# Patient Record
Sex: Male | Born: 1990
Health system: Southern US, Community
[De-identification: ages and names within clinical notes are randomized; demographics above are authoritative.]

## PROBLEM LIST (undated history)

## (undated) DIAGNOSIS — F1910 Other psychoactive substance abuse, uncomplicated: Secondary | ICD-10-CM

## (undated) DIAGNOSIS — F32A Depression, unspecified: Secondary | ICD-10-CM

## (undated) DIAGNOSIS — R1115 Cyclical vomiting syndrome unrelated to migraine: Secondary | ICD-10-CM

## (undated) DIAGNOSIS — I10 Essential (primary) hypertension: Secondary | ICD-10-CM

## (undated) DIAGNOSIS — F329 Major depressive disorder, single episode, unspecified: Secondary | ICD-10-CM

## (undated) HISTORY — DX: Major depressive disorder, single episode, unspecified: F32.9

## (undated) HISTORY — PX: EYE SURGERY: SHX253

## (undated) HISTORY — DX: Cyclical vomiting syndrome unrelated to migraine: R11.15

## (undated) HISTORY — DX: Depression, unspecified: F32.A

## (undated) HISTORY — DX: Other psychoactive substance abuse, uncomplicated: F19.10

## (undated) HISTORY — DX: Essential (primary) hypertension: I10

---
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
## 2009-03-06 ENCOUNTER — Encounter: Payer: Self-pay | Admitting: Family Medicine

## 2009-03-06 ENCOUNTER — Ambulatory Visit: Payer: Self-pay | Admitting: Family Medicine

## 2009-03-06 DIAGNOSIS — F329 Major depressive disorder, single episode, unspecified: Secondary | ICD-10-CM | POA: Insufficient documentation

## 2009-03-06 DIAGNOSIS — R112 Nausea with vomiting, unspecified: Secondary | ICD-10-CM | POA: Insufficient documentation

## 2009-03-06 LAB — CONVERTED CEMR LAB
AST: 15 units/L (ref 0–37)
Albumin: 4.8 g/dL (ref 3.5–5.2)
Alkaline Phosphatase: 65 units/L (ref 39–117)
Bilirubin Urine: NEGATIVE
Calcium: 9.8 mg/dL (ref 8.4–10.5)
Potassium: 2.9 meq/L — ABNORMAL LOW (ref 3.5–5.3)
Protein, U semiquant: 100
Sodium: 134 meq/L — ABNORMAL LOW (ref 135–145)
Specific Gravity, Urine: 1.02
Urobilinogen, UA: >=8
pH: 7

## 2009-03-07 ENCOUNTER — Ambulatory Visit: Payer: Self-pay | Admitting: Family Medicine

## 2009-03-07 ENCOUNTER — Encounter: Payer: Self-pay | Admitting: Family Medicine

## 2009-03-07 LAB — CONVERTED CEMR LAB
BUN: 13 mg/dL (ref 6–23)
CO2: 28 meq/L (ref 19–32)
Calcium: 10 mg/dL (ref 8.4–10.5)
Chloride: 98 meq/L (ref 96–112)
Creatinine, Ser: 0.88 mg/dL (ref 0.40–1.50)
Glucose, Bld: 125 mg/dL — ABNORMAL HIGH (ref 70–99)
Potassium: 3.4 meq/L — ABNORMAL LOW (ref 3.5–5.3)
Sodium: 135 meq/L (ref 135–145)

## 2009-03-09 ENCOUNTER — Ambulatory Visit: Payer: Self-pay | Admitting: Family Medicine

## 2009-04-18 ENCOUNTER — Telehealth: Payer: Self-pay | Admitting: Family Medicine

## 2009-04-19 ENCOUNTER — Ambulatory Visit: Payer: Self-pay | Admitting: Family Medicine

## 2009-06-03 ENCOUNTER — Emergency Department (HOSPITAL_COMMUNITY): Admission: EM | Admit: 2009-06-03 | Discharge: 2009-06-03 | Payer: Self-pay | Admitting: Emergency Medicine

## 2009-06-05 ENCOUNTER — Encounter: Payer: Self-pay | Admitting: Family Medicine

## 2009-06-08 ENCOUNTER — Encounter: Payer: Self-pay | Admitting: Family Medicine

## 2009-06-08 HISTORY — PX: ESOPHAGOGASTRODUODENOSCOPY: SHX1529

## 2009-11-01 ENCOUNTER — Inpatient Hospital Stay (HOSPITAL_COMMUNITY)
Admission: EM | Admit: 2009-11-01 | Discharge: 2009-11-02 | Payer: Self-pay | Source: Home / Self Care | Admitting: Family Medicine

## 2009-11-01 ENCOUNTER — Encounter: Payer: Self-pay | Admitting: Family Medicine

## 2009-11-01 ENCOUNTER — Ambulatory Visit: Payer: Self-pay | Admitting: Family Medicine

## 2009-11-01 LAB — CONVERTED CEMR LAB
ALT: 25 units/L (ref 0–53)
AST: 24 units/L (ref 0–37)
BUN: 6 mg/dL (ref 6–23)
CO2: 24 meq/L (ref 19–32)
Creatinine, Ser: 0.83 mg/dL (ref 0.40–1.50)
Glucose, Bld: 119 mg/dL — ABNORMAL HIGH (ref 70–99)
HCT: 44.5 % (ref 39.0–52.0)
Hemoglobin: 15.8 g/dL (ref 13.0–17.0)
MCHC: 35.5 g/dL (ref 30.0–36.0)
MCV: 83.6 fL (ref 78.0–100.0)
RBC: 5.32 M/uL (ref 4.22–5.81)
Sodium: 141 meq/L (ref 135–145)
Total Protein: 8.2 g/dL (ref 6.0–8.3)

## 2009-11-06 ENCOUNTER — Encounter: Payer: Self-pay | Admitting: Family Medicine

## 2010-02-07 NOTE — Letter (Signed)
Summary: Out of School  [facility]  [address]   [place], [place] [address]   Phone: [phone]  Fax: [phone]    October 24, 2008   Student:  Samuel Robertson    To Whom It May Concern:   For Medical reasons, please excuse the above named student from school for the following dates:  Start:   10/05/08  End:    10/20/08  If you need additional information, please feel free to contact our office.   Sincerely,    Helane Rima DO    ****This is a legal document and cannot be tampered with.  Schools are authorized to verify all information and to do so accordingly.

## 2010-02-07 NOTE — Progress Notes (Signed)
Summary: please read message/ts  Phone Note Call from Patient Call back at [phone]   Caller: mom-Martina Summary of Call: he is in [facility] for stomach pains and mom would like to talk to Dr. Earlene Plater about this. Initial call taken by: Clydell Hakim,  October 16, 2008 8:51 AM  Follow-up for Phone Call        pt has upcoming appt with dr.Novice Vrba. 10-18-08. if pt is in hospital, he will be taken care of, needs to keep appt with dr.Caira Poche. she can not call the pt for this. Follow-up by: Arlyss Repress CMA,,  October 16, 2008 12:38 PM  Additional Follow-up for Phone Call Additional follow up Details #1::        i will be going to see the patient in the hospital in the am and will speak with her if she is there. Additional Follow-up by: Helane Rima MD,  October 16, 2008 6:21 PM     Appended Document: please read message/ts Saw patient in hospital and discussed with patient's mother.

## 2010-02-07 NOTE — Assessment & Plan Note (Signed)
Summary: f/u weds visit/per wallace/eo   Vital Signs:  Patient profile:   20 year old male Weight:      145 pounds Pulse rate:   77 / minute BP sitting:   119 / 80  Vitals Entered By: Arlyss Repress CMA, (March 09, 2009 11:07 AM) CC: f/up labs and last ov. pt states 'i feel much better' Is Patient Diabetic? No Pain Assessment Patient in pain? no        Primary Care Provider:  Helane Rima DO  CC:  f/up labs and last ov. pt states 'i feel much better'.  History of Present Illness: CC: f/u abd pain/n/v  f/u today of nausea/vomiting and abd pain thought to be CVS exacerbated by EtOH and MJ.  Sxs started 1 wk ago, states improved with zofran 8mg  and controlled with ativan.  Now good appetite, keeping food down.  Also found to be hypokalemic to 2.9, repleted with bananas and KCl supplementation.  Quit smoking years ago.  Recently stopped MJ and EtOH because of exacerbation of n/v.  Never diarrhea or fever.  Habits & Providers  Alcohol-Tobacco-Diet     Tobacco Status: quit  Current Medications (verified): 1)  Protonix 40 Mg  Tbec (Pantoprazole Sodium) .... Once Daily 2)  Propranolol Hcl 40 Mg Tabs (Propranolol Hcl) .... One By Mouth Two Times A Day 3)  Promethazine Hcl 25 Mg Tabs (Promethazine Hcl) .... One By Mouth Q 6 Hours As Needed Vomiting 4)  Ondansetron Hcl 8 Mg Tabs (Ondansetron Hcl) .... One Tab Three Times A Day As Needed Nausea 5)  Potassium Chloride Cr 10 Meq  Tbcr (Potassium Chloride) .... Take 2 By Mouth Daily 6)  Remeron 15 Mg  Tabs (Mirtazapine) .Marland Kitchen.. 1 Qhs 7)  Ativan 0.5 Mg  Tabs (Lorazepam) .... 1/2 To 1 By Mouth Up To Twice Daily  Allergies (verified): No Known Drug Allergies  Past History:  Past medical, surgical, family and social histories (including risk factors) reviewed for relevance to current acute and chronic problems.  Past Medical History: Reviewed history from 12/20/2008 and no changes required. Pes planus w/ occasional achilles  tendonitis Hospitalization for intractable nausea/vomiting/abd pain secondary to GE, gastritis, + candida    -- 10/13/08 - 10/19/08, 11/14/08 - 11/20/08  Past Surgical History: Reviewed history from 12/20/2008 and no changes required. R eye surgery  Family History: Reviewed history from 12/20/2008 and no changes required. HTN - mother's side of family  Mother with Cutaneous Sarcoidosis Mother and Father have GERD   Social History: Reviewed history from 03/06/2009 and no changes required. 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  Lives with mother and partially with girlfriend. Nonsmoker, reports marijuana use. Denies ETOH and other drugs.Smoking Status:  quit  Physical Exam  General:  Well-developed,well-nourished,in no acute distress; alert,appropriate and cooperative throughout examination Abdomen:  Bowel sounds positive,abdomen soft and non-tender without masses, organomegaly or hernias noted. Extremities:  no bilateral lower extremity edema   Impression & Recommendations:  Problem # 1:  NAUSEA WITH VOMITING (ICD-787.01) feeling better.  advised to use meds only as PRNs.  encouragd abstinence from MJ, EtOH.  ? CVS that responded well to Zofran 8mg  and ativan.  RTC as needed. Orders: [facility]- Est Level  3 ([address])  Problem # 2:  HYPOKALEMIA (ICD-276.8) K up to 3.4 2 days ago.  no further need to check as pt feeling better.  encourage fruits/vegetables.  Complete Medication List: 1)  Protonix 40 Mg Tbec (Pantoprazole sodium) .... Once daily 2)  Propranolol Hcl  40 Mg Tabs (Propranolol hcl) .... One by mouth two times a day 3)  Promethazine Hcl 25 Mg Tabs (Promethazine hcl) .... One by mouth q 6 hours as needed vomiting 4)  Ondansetron Hcl 8 Mg Tabs (Ondansetron hcl) .... One tab three times a day as needed nausea 5)  Potassium Chloride Cr 10 Meq Tbcr (Potassium chloride) .... Take 2 by mouth daily 6)  Remeron 15 Mg Tabs (Mirtazapine) .Marland Kitchen.. 1 qhs 7)  Ativan 0.5 Mg Tabs  (Lorazepam) .... 1/2 to 1 by mouth up to twice daily  Patient Instructions: 1)  I'm glad you are feeling better. 2)  Restart normal diet slowly, trying to stay away from fried greasy fatty spicy foods at least initially. 3)  I'm glad you've stopped the MJ and alcohol - this will hopefully help prevent further nausea attacks. 4)  If you make sure to eat plenty of fruits and vegetables, I think you can slowly back off on the potassium supplementation over the next several days. 5)  Call clinic with questions.

## 2010-02-07 NOTE — Miscellaneous (Signed)
Summary: Orders Update   Clinical Lists Changes  Orders: Added new Referral order of Gastroenterology Referral (GI) - Signed 
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
## 2010-02-07 NOTE — Assessment & Plan Note (Signed)
Summary: persistant nausea/Atkinson/wallace   Vital Signs:  Patient profile:   20 year old male Weight:      146 pounds Temp:     98.4 degrees F Pulse rate:   69 / minute BP sitting:   151 / 110  Vitals Entered By: Jone Baseman CMA (April 19, 2009 3:42 PM) CC: N&V x 4 days Pain Assessment Patient in pain? yes     Location: stomach Intensity: 5   Primary Care Provider:  Helane Rima DO  CC:  N&V x 4 days.  History of Present Illness: 1) Nausea / emesis: x 4 days. Chronic problem thought to be cyclic vomiting syndrome. Started 4 dayds ago; no specific trigger. Non-bloody, non-bilious. Threw up 5-10 times on first day, then 1 x per day for the next few days. Has been taking Zofran once a day. Smokes marijuana - the smell makes him nauseated, but the overall effect is to call him down and help with nausea.  Reports mild presyncope. Reports constipation (no BM in past 4 days); this has also been a chronic issue for him. Reports some mild diffuse abdominal pain. Denies fever, sick contacts, diarrhea, melena, hematochezia, dyspnea.  Drinking Gatorade to help stay hydrated and for electrolytes.   Habits & Providers  Alcohol-Tobacco-Diet     Tobacco Status: never  Current Medications (verified): 1)  Protonix 40 Mg  Tbec (Pantoprazole Sodium) .... Once Daily 2)  Propranolol Hcl 40 Mg Tabs (Propranolol Hcl) .... One By Mouth Two Times A Day 3)  Promethazine Hcl 25 Mg Tabs (Promethazine Hcl) .... One By Mouth Q 6 Hours As Needed Vomiting 4)  Ondansetron Hcl 8 Mg Tabs (Ondansetron Hcl) .... One Tab Three Times A Day As Needed Nausea 5)  Potassium Chloride Cr 10 Meq  Tbcr (Potassium Chloride) .... Take 2 By Mouth Daily 6)  Remeron 15 Mg  Tabs (Mirtazapine) .Marland Kitchen.. 1 Qhs 7)  Ativan 0.5 Mg  Tabs (Lorazepam) .... 1/2 To 1 By Mouth Up To Twice Daily 8)  Miralax  Powd (Polyethylene Glycol 3350) .... One Serving Each Day By Mouth. Mix As Instructed. Disp: One Month Supply  Allergies  (verified): No Known Drug Allergies  Social History: Smoking Status:  never  Physical Exam  General:  appears tired, but NAD. flat affect Eyes:  PERRL, EOMI. Mouth:  moist membranes  Lungs:  Normal respiratory effort, chest expands symmetrically. Lungs are clear to auscultation, no crackles or wheezes. Heart:  Normal rate and regular rhythm. S1 and S2 normal without gallop, murmur, click, rub or other extra sounds Abdomen:  Bowel sounds positive, abdomen mild diffuse tender, soft without masses, organomegaly or hernias noted. no rebound or guarding   Impression & Recommendations:  Problem # 1:  VOMITING, PERSISTENT, HX OF (ICD-V12.79) Assessment Deteriorated  Will treat with phenergan 25 mg IM now. Will refill Phenergan as he has run out. Will treat constipation with Miralax daily, higher fiber diet. Follow up with Dr. Earlene Plater in one week. No red flags on exam or history. No signs of acute abdomen. Appears well hydrated. Does not want anything for pain at this time.   Orders: [facility]- Est Level  3 ([address])  Complete Medication List: 1)  Protonix 40 Mg Tbec (Pantoprazole sodium) .... Once daily 2)  Propranolol Hcl 40 Mg Tabs (Propranolol hcl) .... One by mouth two times a day 3)  Promethazine Hcl 25 Mg Tabs (Promethazine hcl) .... One by mouth q 6 hours as needed vomiting 4)  Ondansetron Hcl 8 Mg Tabs (  Ondansetron hcl) .... One tab three times a day as needed nausea 5)  Potassium Chloride Cr 10 Meq Tbcr (Potassium chloride) .... Take 2 by mouth daily 6)  Remeron 15 Mg Tabs (Mirtazapine) .Marland Kitchen.. 1 qhs 7)  Ativan 0.5 Mg Tabs (Lorazepam) .... 1/2 to 1 by mouth up to twice daily 8)  Miralax Powd (Polyethylene glycol 3350) .... One serving each day by mouth. mix as instructed. disp: one month supply  Other Orders: Promethazine up to 50mg  ([id number])  Patient Instructions: 1)  Follow up with Dr. Earlene Plater next week. 2)  Take your nausea medication as directed when you are feeling nauseated to  keep you from vomiting.  3)  Eat foods that are high in fiber - get 20 g of fiber per day from the list I gave you. 4)  Drink lots of water to stay well hydrated. 6-8 glasses per day.  5)  Take Miralax daily for constipation.  Prescriptions: ONDANSETRON HCL 8 MG TABS (ONDANSETRON HCL) one tab three times a day as needed nausea  #15 x 3   Entered and Authorized by:   Bobby Rumpf  MD   Signed by:   Bobby Rumpf  MD on 04/20/2009   Method used:   Electronically to        [facility] [address] (retail)       [address]       [place], [place]  [id number]       Ph: [phone]       Fax: [phone]   RxID:   [id number] PROMETHAZINE HCL 25 MG TABS (PROMETHAZINE HCL) one by mouth q 6 hours as needed vomiting  #30 x 0   Entered and Authorized by:   Bobby Rumpf  MD   Signed by:   Bobby Rumpf  MD on 04/20/2009   Method used:   Electronically to        [facility] [address] [address] (retail)       [address]       [place], [place]  [id number]       Ph: [phone]       Fax: [phone]   RxID:   [id number] MIRALAX  POWD (POLYETHYLENE GLYCOL 3350) one serving each day by mouth. Mix as instructed. Disp: one month supply  #1 x 3   Entered and Authorized by:   Bobby Rumpf  MD   Signed by:   Bobby Rumpf  MD on 04/19/2009   Method used:   Electronically to        [facility] [address] (retail)       [address]       [place], [place]  [id number]       Ph: [phone]       Fax: [phone]   RxID:   [id number]      Medication Administration  Injection # 1:    Medication: Promethazine up to 50mg     Diagnosis: NAUSEA WITH VOMITING (ICD-787.01)    Route: IM    Site: LUOQ gluteus    Exp Date: 12/07/2010    Lot #: [id number]    Mfr: baxter    Comments: 25mg  given    Patient tolerated injection without complications    Given by: Jone Baseman CMA (April 19, 2009 4:40 PM)  Orders Added: 1)  Promethazine up to 50mg  [J2550] 2)   [facility]- Est Level  3 [[address]]

## 2010-02-07 NOTE — Consult Note (Signed)
[facility]   Imported By: Clydell Hakim 08/07/2009 16:40:37  _____________________________________________________________________  External Attachment:    Type:   Image     Comment:   External Document

## 2010-02-07 NOTE — Miscellaneous (Signed)
Summary: Referral to Endocrine  ---- Converted from flag ---- ---- 12/24/2008 8:49 PM, Helane Rima DO wrote: please see referral to endocrine ------------------------------  Called Dr. Lyndle Herrlich office, had to leave message with scheduling person, who said it could take up to 48 hrs to return call.  Order and referral letter printed. Asked to ask for red team.  Starleen Blue RN  December 25, 2008 10:23 AM   [facility] [phone] Dr Evlyn Kanner is not taking new pts  De Nurse  December 25, 2008 2:16 PM  records faxed to Dr. Leslie Dales office for referral. Theresia Lo RN  December 25, 2008 3:40 PM  Appended Document: Referral to Endocrine Dr. Leslie Dales faxed note:  Abnormal urinary VMA and cortisol likely due to stress during hospitalization with N/V. Please repeat those tests as outpatient, when not acutely ill, and refer if still abnormal.  Appended Document: Orders Update    Clinical Lists Changes  Orders: Added new Test order of Miscellaneous Lab Charge-[facility] [phone]) - Signed     Recommended labs ordered. Patient to have done when not acutely ill.  Appended Document: Referral to Endocrine Attempted calling x 2 with no answer.

## 2010-02-07 NOTE — Progress Notes (Signed)
Summary: triage  Phone Note Call from Patient Call back at [phone]   Caller: mom-Martina Summary of Call: chills, throwing up wants to be seen today. Initial call taken by: Clydell Hakim,  October 05, 2008 8:39 AM  Follow-up for Phone Call        he does not know if he has a fever. vomiting started yesterday am. appt at 10:30 as work in. aware there may be a wait & that pcp is not here Follow-up by: Golden Circle RN,  October 05, 2008 9:11 AM

## 2010-02-07 NOTE — Assessment & Plan Note (Signed)
Summary: f/up,tcb   Vital Signs:  Patient profile:   20 year old male Weight:      139.5 pounds Temp:     97.8 degrees F Pulse rate:   72 / minute BP sitting:   103 / 67  Vitals Entered By: Starleen Blue RN (December 20, 2008 3:50 PM) CC: f/u hospital for functional vomiting disorder Is Patient Diabetic? No Pain Assessment Patient in pain? no        Primary Care Provider:  Helane Rima DO  CC:  f/u hospital for functional vomiting disorder.  History of Present Illness: 20 year old AAM following up from recent hospitalization for:  1. Nausea and vomiting, unknown etiology, working hypothesis of functional vomiting disorder, multiple labs pending. 2. Hypertension. 3. Hypokalemia, resolved.  This was his second hospitalization for the above reasons. Since his discharge, the patient endorses one further episode of N/V/mild abd pain that resolved after several days. He opted not to go to the clinic/ED for evaluation. He has continued on his discharge medications:  1. Phenergan 25 mg p.o. taken every 6 hours as needed. 2. Propranolol 40 mg p.o. taking two times daily for hypertension. 3. Protonix 40 mg p.o. daily for gastritis and GERD-type symptoms.   The patient has refrained from smoking  marijuana since his last hospitalization. He denies ETOH as well.  Note: Kamonte's mother is also my patient. In her last visit, she expressed concern about Perlie "hanging with the wrong crowd." She stated that he has had trouble in school and moved out the house to move in with his girlfriend.    Habits & Providers  Alcohol-Tobacco-Diet     Tobacco Status: never  Current Medications (verified): 1)  Zofran 4 Mg Tabs (Ondansetron Hcl) .... Take One Tablet Every 4 Hours As Needed For Nausea 2)  Protonix 40 Mg  Tbec (Pantoprazole Sodium) .... Once Daily 3)  Propranolol Hcl 40 Mg Tabs (Propranolol Hcl) .... One By Mouth Two Times A Day 4)  Promethazine Hcl 25 Mg Tabs (Promethazine  Hcl) .... One By Mouth Q 6 Hours As Needed Vomiting  Allergies (verified): No Known Drug Allergies  Past History:  Past medical, surgical, family and social histories (including risk factors) reviewed, and no changes noted (except as noted below).  Past Medical History: Pes planus w/ occasional achilles tendonitis Hospitalization for intractable nausea/vomiting/abd pain secondary to GE, gastritis, + candida    -- 10/13/08 - 10/19/08, 11/14/08 - 11/20/08  Past Surgical History: R eye surgery  Family History: Reviewed history from 11/14/2008 and no changes required. HTN - mother's side of family  Mother with Cutaneous Sarcoidosis Mother and Father have GERD   Social History: Reviewed history from 11/14/2008 and no changes required. 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  Lives with mother. Reports no marijuana since last hospitalization. Denies ETOH and other drugs.Smoking Status:  never  Review of Systems General:  Denies chills and fever. CV:  Denies chest pain or discomfort. Resp:  Denies cough and shortness of breath. GI:  Denies abdominal pain, bloody stools, constipation, dark tarry stools, diarrhea, indigestion, loss of appetite, nausea, vomiting, and vomiting blood. Psych:  Denies anxiety and depression.  Physical Exam  General:  NAD, thin, well-hydrated, vitals reviewed. Mouth:  Pharynx pink and moist.   Lungs:  Normal respiratory effort, no crackles, and no wheezes or ronchi.   Heart:  Normal rate, regular rhythm, no murmur, no gallop, and no rub.   Abdomen:  Bowel sounds positive,abdomen soft and non-tender without  masses, organomegaly or hernias noted. Psych:  Normally interactive, good eye contact, and not anxious appearing.  Slightly flat affect but smiles when appropriate.   Impression & Recommendations:  Problem # 1:  VOMITING, PERSISTENT, HX OF (ICD-V12.79) Assessment Unchanged No issues today. Refilled Rx. Reviewed symtpomatic treatment and  indications for seeking medical care.  Orders: [facility]- Est  Level 4 (04540)  Problem # 2:  HYPOKALEMIA (ICD-276.8) Assessment: Improved Patient endorses taking mother's Rx of potassium since he had another episode of N/V. He does not know that dose and says that he does not take it every day. Will check BMP. Orders: [facility]- Est  Level 4 (98119) Future Orders: Basic Met-[facility] ([id number]) ... 12/14/2009  Problem # 3:  ESSENTIAL HYPERTENSION, BENIGN (ICD-401.1) Assessment: Improved BP WNL today. Will refill Rx. Noted: patient started on Propranolol for possibility that if this is a pheochromocytoma. His updated medication list for this problem includes:    Propranolol Hcl 40 Mg Tabs (Propranolol hcl) ..... One by mouth two times a day  Orders: [facility]- Est  Level 4 (99214)  Complete Medication List: 1)  Zofran 4 Mg Tabs (Ondansetron hcl) .... Take one tablet every 4 hours as needed for nausea 2)  Protonix 40 Mg Tbec (Pantoprazole sodium) .... Once daily 3)  Propranolol Hcl 40 Mg Tabs (Propranolol hcl) .... One by mouth two times a day 4)  Promethazine Hcl 25 Mg Tabs (Promethazine hcl) .... One by mouth q 6 hours as needed vomiting  Patient Instructions: 1)  I want to check your potassium. 2)  Call me with the amount that you have been taking each day. 3)  I am refilling all of your medicines today. 4)  Call if you have any questions or concerns. Prescriptions: PROTONIX 40 MG  TBEC (PANTOPRAZOLE SODIUM) once daily  #30 x 6   Entered and Authorized by:   Helane Rima DO   Signed by:   Helane Rima DO on 12/21/2008   Method used:   Electronically to        [facility] (retail)       [address]       [place], [place]  [address]       Ph: [phone]       Fax: [phone]   RxID:   [id number] ZOFRAN 4 MG TABS (ONDANSETRON HCL) take one tablet every 4 hours as needed for nausea  #12 x 0   Entered and Authorized by:   Helane Rima DO   Signed by:    Helane Rima DO on 12/21/2008   Method used:   Electronically to        [facility] (retail)       [address]       [place], [place]  [id number]       Ph: [phone]       Fax: [phone]   RxID:   [id number] PROMETHAZINE HCL 25 MG TABS (PROMETHAZINE HCL) one by mouth q 6 hours as needed vomiting  #30 x 0   Entered and Authorized by:   Helane Rima DO   Signed by:   Helane Rima DO on 12/21/2008   Method used:   Electronically to        [facility] (retail)       [address]       [place], [place]  [id number]       Ph: [phone]  Fax: [phone]   RxID:   [id number] PROPRANOLOL HCL 40 MG TABS (PROPRANOLOL HCL) one by mouth two times a day  #60 x 1   Entered and Authorized by:   Helane Rima DO   Signed by:   Helane Rima DO on 12/21/2008   Method used:   Electronically to        [facility] (retail)       [address]       [place], [place]  [address]       Ph: [phone]       Fax: [phone]   RxID:   [id number]   Prevention & Chronic Care Immunizations   Influenza vaccine: Fluvax Non-MCR  (10/18/2007)    Tetanus booster: Not documented    Pneumococcal vaccine: Not documented  Other Screening   Smoking status: never  (12/20/2008)  Hypertension   Last Blood Pressure: 103 / 67  (12/20/2008)   Serum creatinine: Not documented   Serum potassium Not documented    Hypertension flowsheet reviewed?: Yes   Progress toward BP goal: At goal  Self-Management Support :   Personal Goals (by the next clinic visit) :      Personal blood pressure goal: 140/90  (12/20/2008)   Patient will work on the following items until the next clinic visit to reach self-care goals:     Medications and monitoring: take my medicines every day  (12/20/2008)     Eating: drink diet soda or water instead of juice or soda, eat more vegetables, use fresh or frozen vegetables, eat  foods that are low in salt, eat baked foods instead of fried foods, eat fruit for snacks and desserts, limit or avoid alcohol  (12/20/2008)    Hypertension self-management support: Written self-care plan  (12/20/2008)   Hypertension self-care plan printed.

## 2010-02-07 NOTE — Assessment & Plan Note (Signed)
Summary: vomiting,tcb   Vital Signs:  Patient profile:   20 year old male Weight:      140 pounds Temp:     98.3 degrees F oral Pulse rate:   72 / minute Pulse (ortho):   112 / minute BP sitting:   138 / 106  (left arm) BP standing:   146 / 102 Cuff size:   regular  Vitals Entered By: Renato Battles slade,cma  Serial Vital Signs/Assessments:  Time      Position  BP       Pulse  Resp  Temp     By           Lying LA  146/106  85                    Luretha Murphy NP           Sitting   158/106  95                    Luretha Murphy NP           Standing  146/102  112                   Luretha Murphy NP  CC: feels weak since friday. nasal congestion and vomit x 1 today. re-checked BP 150/92. pt's mom has HTN Is Patient Diabetic? No Pain Assessment Patient in pain? no        Primary Care Provider:  Helane Rima DO  CC:  feels weak since friday. nasal congestion and vomit x 1 today. re-checked BP 150/92. pt's mom has HTN.  History of Present Illness: Patient reports onset of nasal congestion, post nasal drip, sore throat, vomiting and weakness for four days associated with chills and night sweats. Reports first began with nasal congestion and coughing up mucus to vomiting on the second day.  Reports no known fever, but admits to generalized malaise and inability to keep most food/fluids down even with Zofran and Phenergan use.  Reports no associated diarrhea, no sick contacts noted, reports he has had episodes in the past with nausea and vomiting for which he was hospitalized, unsure of diagnosis.  Habits & Providers  Alcohol-Tobacco-Diet     Tobacco Status: current     Tobacco Counseling: to quit use of tobacco products  Current Medications (verified): 1)  Zofran 4 Mg Tabs (Ondansetron Hcl) .... Take One Tablet Every 4 Hours As Needed For Nausea 2)  Protonix 40 Mg  Tbec (Pantoprazole Sodium) .... Once Daily 3)  Propranolol Hcl 40 Mg Tabs (Propranolol Hcl) .... One By Mouth Two Times A  Day 4)  Promethazine Hcl 25 Mg Tabs (Promethazine Hcl) .... One By Mouth Q 6 Hours As Needed Vomiting  Allergies (verified): No Known Drug Allergies  Past History:  Past Medical History: Reviewed history from 12/20/2008 and no changes required. Pes planus w/ occasional achilles tendonitis Hospitalization for intractable nausea/vomiting/abd pain secondary to GE, gastritis, + candida    -- 10/13/08 - 10/19/08, 11/14/08 - 11/20/08  Past Surgical History: Reviewed history from 12/20/2008 and no changes required. R eye surgery  Family History: Reviewed history from 12/20/2008 and no changes required. 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  Lives with mother and partially with girlfriend. Nonsmoker, reports marijuana use. Denies ETOH and other drugs.Smoking Status:  current  Review of Systems General:  Complains of chills, fatigue, malaise, and sweats; denies fever and loss of appetite. Eyes:  Denies discharge, eye irritation, eye pain, and light sensitivity. ENT:  Complains of nasal congestion, postnasal drainage, and sore throat; denies decreased hearing, difficulty swallowing, ear discharge, earache, hoarseness, ringing in ears, and sinus pressure. CV:  Complains of fatigue; denies bluish discoloration of lips or nails, chest pain or discomfort, difficulty breathing at night, lightheadness, near fainting, palpitations, and shortness of breath with exertion. Resp:  Complains of cough, shortness of breath, and sputum productive; denies chest discomfort, chest pain with inspiration, and wheezing. GI:  Complains of nausea and vomiting; denies abdominal pain, dark tarry stools, and diarrhea. GU:  Denies dysuria, hematuria, urinary frequency, and urinary hesitancy. Psych:  Complains of depression.  Physical Exam  General:  Ill appearing, small frame, in no acute distress;  alert,appropriate and cooperative throughout examination Eyes:  No corneal or conjunctival inflammation noted. EOMI. Perrla.  Ears:  External ear exam shows no significant lesions or deformities.  Otoscopic examination reveals clear canals, tympanic membranes are intact bilaterally without bulging, retraction, inflammation or discharge. Hearing is grossly normal bilaterally. Nose:  External nasal examination shows no deformity or inflammation. Nasal mucosa are pink and moist without lesions or exudates. Mouth:  pharyngeal erythema Neck:  No deformities, masses, or tenderness noted. Chest Wall:  No deformities, masses, tenderness Lungs:  Normal respiratory effort, chest expands symmetrically. Lungs are clear to auscultation, no crackles or wheezes. Heart:  Normal rate and regular rhythm. S1 and S2 normal without gallop, murmur, click, rub or other extra sounds, PMI easily visible. Abdomen:  Bowel sounds positive,abdomen soft and non-tender without masses, or organomegaly. Pulses:  R and L carotid,radial,femoral,dorsalis pedis and posterior tibial pulses are full and equal bilaterally Neurologic:  alert & oriented X3, strength normal in all extremities, and gait normal.   Skin:  Intact without suspicious lesions or rashes Cervical Nodes:  No lymphadenopathy noted Psych:  Oriented X3, memory intact for recent and remote, and poor eye contact.     Impression & Recommendations:  Problem # 1:  NAUSEA WITH VOMITING (ICD-787.01) Hx of recurrent vomiting  in the past 6 months with three hospitalizations, extensive workups.  Will increase Zofran to 8mg  three times a day, Encourage hydration with Pedialyte or similiar substance, patient also to begin eating as soon as possible.  Follow up primary doctor tomorrow.  Stat CMP--K+ at 2.9, patient was called: he felt he could not get back to his drug store in [place] to pick up KCL pills, recommended 3-4 bananas, complete Pedialyte and then start Gatoraid.   He reported that he was no longer vomiting, if vomiting returns he probably should go to ER for hydration and KCL replacement.. Orders: Urinalysis-[facility] (00000) Comp Met-[facility] ([id number]) Rapid Strep-[facility] (09811) [facility]- Est  Level 4 (91478)  Problem # 2:  DEPRESSION (ICD-311)  Expressed depressive moods with periods of not eating, consider Mirtazapine to assist with depression and as an appetite stimulant.  Orders: [facility]- Est  Level 4 (29562)  Problem # 3:  ESSENTIAL HYPERTENSION, BENIGN (ICD-401.1) Refill Propanolol as attempts to rule out pheochromocytoma persist.  Patient for 24 hour urine in future. His updated medication list for this problem includes:    Propranolol Hcl 40 Mg Tabs (Propranolol hcl) ..... One by mouth two times a day  Complete Medication List: 1)  Protonix 40 Mg Tbec (Pantoprazole sodium) .... Once daily 2)  Propranolol Hcl 40 Mg Tabs (Propranolol hcl) .... One by mouth two times a  day 3)  Promethazine Hcl 25 Mg Tabs (Promethazine hcl) .... One by mouth q 6 hours as needed vomiting 4)  Ondansetron Hcl 8 Mg Tabs (Ondansetron hcl) .... One tab three times a day as needed nausea  Patient Instructions: 1)  Start Pedialyte, drink 30 cc every 15 minutes, for up to one liter.  2)  Then really try to force fluids 3)  Food is important 4)  Place on Dr. Philis Pique schedule tomorrow (double book if needed, she says it is oK) Prescriptions: PROPRANOLOL HCL 40 MG TABS (PROPRANOLOL HCL) one by mouth two times a day  #60 x 6   Entered and Authorized by:   Luretha Murphy NP   Signed by:   Luretha Murphy NP on 03/06/2009   Method used:   Electronically to        [facility] (retail)       [address]       [place], [place]  [id number]       Ph: [phone]       Fax: [phone]   RxID:   [id number] ONDANSETRON HCL 8 MG TABS (ONDANSETRON HCL) one tab three times a day as needed nausea  #15 x 3   Entered and Authorized by:   Luretha Murphy NP   Signed  by:   Luretha Murphy NP on 03/06/2009   Method used:   Electronically to        [facility] (retail)       [address]       [place], [place]  [id number]       Ph: [phone]       Fax: [phone]   RxID:   [id number]   Laboratory Results   Urine Tests  Date/Time Received: March 06, 2009 11:55 AM  Date/Time Reported: March 06, 2009 12:40 PM   Routine Urinalysis   Color: yellow Appearance: Clear Glucose: negative   (Normal Range: Negative) Bilirubin: small;  reflex to ictotest = negative   (Normal Range: Negative) Ketone: small (15)   (Normal Range: Negative) Spec. Gravity: 1.020   (Normal Range: 1.003-1.035) Blood: trace-intact   (Normal Range: Negative) pH: 7.0   (Normal Range: 5.0-8.0) Protein: 100   (Normal Range: Negative) Urobilinogen: >=8.0   (Normal Range: 0-1) Nitrite: negative   (Normal Range: Negative) Leukocyte Esterace: negative   (Normal Range: Negative)  Urine Microscopic WBC/HPF: rare RBC/HPF: 1-5 Bacteria/HPF: trace Mucous/HPF: 2+ Epithelial/HPF: rare    Comments: biochemical ............test performed by...........Marland Kitchen Terese Door, CMA micro ...............test performed by......Marland KitchenBonnie A. [place], MLS (ASCP)cm  Date/Time Received: March 06, 2009 11:55 AM  Date/Time Reported: March 06, 2009 12:37 PM   Other Tests  Rapid Strep: negative Comments: ...............test performed by......Marland KitchenBonnie A. [place], MLS (ASCP)cm

## 2010-02-07 NOTE — Assessment & Plan Note (Signed)
Summary: FU PER [facility]XON/KH   Vital Signs:  Patient profile:   20 year old male Weight:      140.1 pounds Temp:     97.8 degrees F oral Pulse rate:   75 / minute BP sitting:   149 / 108  (left arm) Cuff size:   regular  Vitals Entered By: Gladstone Pih (March 07, 2009 1:43 PM) CC: F/U Is Patient Diabetic? No   Primary Care Provider:  Helane Rima DO  CC:  F/U.  History of Present Illness: Please see previous clinic note for further details. 20 year old AAM with previous episodes of N/V/abdominal pain, presenting with the same. He presented yesterday and was evaluated. He was sent home with anti-emetics and instructions to drink fluids and replace K. Today, he denies improvement. He has been tolerating by mouth intake. NBNB vomiting x 1 this am. No fever/chills, diarrhea, abdominal pain, HA, dizziness. He ate 3 bananas yesterday when called about his potassium.  Habits & Providers  Alcohol-Tobacco-Diet     Tobacco Status: current     Tobacco Counseling: to quit use of tobacco products  Current Medications (verified): 1)  Protonix 40 Mg  Tbec (Pantoprazole Sodium) .... Once Daily 2)  Propranolol Hcl 40 Mg Tabs (Propranolol Hcl) .... One By Mouth Two Times A Day 3)  Promethazine Hcl 25 Mg Tabs (Promethazine Hcl) .... One By Mouth Q 6 Hours As Needed Vomiting 4)  Ondansetron Hcl 8 Mg Tabs (Ondansetron Hcl) .... One Tab Three Times A Day As Needed Nausea 5)  Potassium Chloride Cr 10 Meq  Tbcr (Potassium Chloride) .... Take 2 By Mouth Daily 6)  Remeron 15 Mg  Tabs (Mirtazapine) .Marland Kitchen.. 1 Qhs 7)  Ativan 0.5 Mg  Tabs (Lorazepam) .... 1/2 To 1 By Mouth Up To Twice Daily  Allergies (verified): No Known Drug Allergies PMH-FH-SH reviewed for relevance  Social History: Not in school anymore. Living with girlfriend. Admits to smoking MJ 1-2 times daily. 1 "cup" ETOH every other day. Denies other drug use.  Review of Systems  The patient denies fever, weight loss, weight gain, chest pain,  syncope, dyspnea on exertion, peripheral edema, prolonged cough, headaches, abdominal pain, melena, hematochezia, severe indigestion/heartburn, and suspicious skin lesions.    Physical Exam  General:  Ill appearing, small frame, in no acute distress; alert, appropriate and cooperative throughout examination. Vitals reviewed. Mouth:  MMM Neck:  No deformities, masses, or tenderness noted. Lungs:  Normal respiratory effort, chest expands symmetrically. Lungs are clear to auscultation, no crackles or wheezes. Heart:  Normal rate and regular rhythm. S1 and S2 normal without gallop, murmur, click, rub or other extra sounds, PMI easily visible. Abdomen:  Bowel sounds positive,abdomen soft and non-tender without masses, or organomegaly. Psych:  Oriented X3, memory intact for recent and remote, and poor eye contact.     Impression & Recommendations:  Problem # 1:  NAUSEA WITH VOMITING (ICD-787.01) Assessment Deteriorated Patient was able to keep down food (including 3 bananas for K replacement) and fluids after [facility] [place] visit yesterday. He endorses one episode of N/V this am, about 4 hours after eating. He has been drinking fluids today. He has been continuing to take Zofran, Phenergan, Protonix. No known trigger per patient except new URI vs allergy s/s. He does endorse daily marijuana use (2+/day) and ETOH use (1 glass every other day) until 3 days ago when the N/V started. Allowed patient to go home since his VSS and his girlfriend is willing to watch him closely.  RED FLAGs given. I suspect that he may have cyclical vomiting syndrome (CVS) and that his marijuana/ETOH use may play a role. Discussed the connection between CHRONIC MJ use and CVS. Also discussed the connection between ETOH use and gastritis. Rx potassium supplements, STAT BMP. Rx low dose Ativan for CVS. Follow up with Luretha Murphy on Friday or sooner if needed. Orders: [facility] (04540-98119) [facility]- Est  Level 4 (14782)  Problem # 2:   HYPOKALEMIA (ICD-276.8) Assessment: Unchanged STAT BMP showing improved K at 3.4. Will Rx KCl supplements for the next few days to continue to replete. Orders: [facility] (95621-30865) [facility]- Est  Level 4 (78469)  Problem # 3:  DEPRESSION (ICD-311) Assessment: Unchanged  Depression seems to be playing a role here as well. Will Rx Remeron to help with appetite. Will monitor for need of increased dose His updated medication list for this problem includes:    Remeron 15 Mg Tabs (Mirtazapine) .Marland Kitchen... 1 qhs    Ativan 0.5 Mg Tabs (Lorazepam) .Marland Kitchen... 1/2 to 1 by mouth up to twice daily  Orders: [facility]- Est  Level 4 (62952)  Problem # 4:  ESSENTIAL HYPERTENSION, BENIGN (ICD-401.1) Assessment: Unchanged Will need further testing once acute episode over. Continue Propranolol. His updated medication list for this problem includes:    Propranolol Hcl 40 Mg Tabs (Propranolol hcl) ..... One by mouth two times a day  Complete Medication List: 1)  Protonix 40 Mg Tbec (Pantoprazole sodium) .... Once daily 2)  Propranolol Hcl 40 Mg Tabs (Propranolol hcl) .... One by mouth two times a day 3)  Promethazine Hcl 25 Mg Tabs (Promethazine hcl) .... One by mouth q 6 hours as needed vomiting 4)  Ondansetron Hcl 8 Mg Tabs (Ondansetron hcl) .... One tab three times a day as needed nausea 5)  Potassium Chloride Cr 10 Meq Tbcr (Potassium chloride) .... Take 2 by mouth daily 6)  Remeron 15 Mg Tabs (Mirtazapine) .Marland Kitchen.. 1 qhs 7)  Ativan 0.5 Mg Tabs (Lorazepam) .... 1/2 to 1 by mouth up to twice daily  Patient Instructions: 1)  Continue to drink Pedialyte and push fluids. I am checking your potassium again and will call to tell you how much to take.  2)  The Remeron is to help with appetite. Prescriptions: ATIVAN 0.5 MG  TABS (LORAZEPAM) 1/2 to 1 by mouth up to twice daily  #12 x 0   Entered and Authorized by:   Helane Rima DO   Signed by:   Helane Rima DO on 03/07/2009   Method used:   Print then Give to  Patient   RxID:   [id number] ONDANSETRON HCL 8 MG TABS (ONDANSETRON HCL) one tab three times a day as needed nausea  #15 x 3   Entered and Authorized by:   Helane Rima DO   Signed by:   Helane Rima DO on 03/07/2009   Method used:   Print then Give to Patient   RxID:   [id number] PROMETHAZINE HCL 25 MG TABS (PROMETHAZINE HCL) one by mouth q 6 hours as needed vomiting  #30 x 0   Entered and Authorized by:   Helane Rima DO   Signed by:   Helane Rima DO on 03/07/2009   Method used:   Print then Give to Patient   RxID:   [id number] PROPRANOLOL HCL 40 MG TABS (PROPRANOLOL HCL) one by mouth two times a day  #60 x 6   Entered and Authorized by:   Helane Rima DO   Signed by:  Helane Rima DO on 03/07/2009   Method used:   Print then Give to Patient   RxID:   [id number] PROTONIX 40 MG  TBEC (PANTOPRAZOLE SODIUM) once daily  #30 x 6   Entered and Authorized by:   Helane Rima DO   Signed by:   Helane Rima DO on 03/07/2009   Method used:   Print then Give to Patient   RxID:   [id number] REMERON 15 MG  TABS (MIRTAZAPINE) 1 qhs  #30 x 3   Entered and Authorized by:   Helane Rima DO   Signed by:   Helane Rima DO on 03/07/2009   Method used:   Print then Give to Patient   RxID:   [id number] POTASSIUM CHLORIDE CR 10 MEQ  TBCR (POTASSIUM CHLORIDE) take 2 by mouth daily  #60 x 0   Entered and Authorized by:   Helane Rima DO   Signed by:   Helane Rima DO on 03/07/2009   Method used:   Print then Give to Patient   RxID:   [id number]   Prevention & Chronic Care Immunizations   Influenza vaccine: Fluvax Non-MCR  (10/18/2007)   Influenza vaccine deferral: Not indicated  (03/07/2009)    Tetanus booster: Not documented    Pneumococcal vaccine: Not documented  Other Screening   Smoking status: current  (03/07/2009)   Smoking cessation counseling: yes  (03/07/2009)  Hypertension   Last Blood Pressure: 149 / 108  (03/07/2009)    Serum creatinine: 0.78  (03/06/2009)   Serum potassium 2.9  (03/06/2009)    Hypertension flowsheet reviewed?: Yes   Progress toward BP goal: Unchanged  Self-Management Support :   Personal Goals (by the next clinic visit) :      Personal blood pressure goal: 140/90  (12/20/2008)   Patient will work on the following items until the next clinic visit to reach self-care goals:     Medications and monitoring: take my medicines every day, bring all of my medications to every visit  (03/07/2009)     Eating: drink diet soda or water instead of juice or soda, eat more vegetables, use fresh or frozen vegetables, eat foods that are low in salt, eat baked foods instead of fried foods, eat fruit for snacks and desserts, limit or avoid alcohol  (03/07/2009)     Activity: take a 30 minute walk every day, take the stairs instead of the elevator, join a walking program  (03/07/2009)    Hypertension self-management support: Written self-care plan  (03/07/2009)   Hypertension self-care plan printed.

## 2010-02-07 NOTE — Miscellaneous (Signed)
Summary: Lab Results      New Problems: OTHER NONSPECIFIC FINDING EXAMINATION OF URINE (ICD-791.9)   New Problems: OTHER NONSPECIFIC FINDING EXAMINATION OF URINE (ICD-791.9) L-Porphyrins, Fract-Quantitative, 24H Ur - STATUS: Final                                            Perform Date: 11Nov10 13:30  Ordered By: Arnoldo Lenis Date: 60AVW09 13:06                                       Last Updated Date: 81XBJ47 11:17  Facility: [facility]                              Department: GENL  Accession #: [id number]                  USN:       [id number]  Findings  Result Name                              Result     Abnl   Normal Range     Units      Perf. Loc.  Volume, Urine-PORPF                      2250                               mL  Time-PORPF                               24                                 hours    Unit: hr  Creatinine, Urine-mg/dL-PORPF            87    Unit: mg/dL  Creatinine, Urine mg/day-PORPF           1958    Reference range: 1000 to 2500    Unit: mg/d  Uroporphyrin, Urine                      1    Reference range: 0 to 4    Unit: umol/mol CRT  Heptacarboxyporphyrin                    0    Reference range: 0 to 2    Unit: umol/mol CRT  Coproporphyrin                           NO GROWTH  Coproporphyrin I                         5    Reference range: 0 to 6    Unit: umol/mol CRT  Coproporphyrin III  10    Reference range: 0 to 14    Unit: umol/mol CRT  Porphyrins Interpretation                Normal    (NOTE)    Results are normalized to creatinine concentration and    reported as a ratio of amounts (micromoles of    porphyrin/moles of creatinine).    Performed at [facility]  Additional Information  HL7 RESULT STATUS : F  External IF Update Timestamp : 2008-11-21:11:14:00.000000 ---------------------------------------------------------------------------------------------  L-Hydroxyindoleacetic  Acid- 5, 24 H Ur - STATUS: Final                                            Perform Date: 11Nov10 13:30  Ordered By: Arnoldo Lenis Date: 16XWR60 13:05                                       Last Updated Date: 18Nov10 10:34  Facility: [facility]                              Department: GENL  Accession #: [id number]                  USN:       [id number]  Findings  Result Name                              Result     Abnl   Normal Range     Units      Perf. Loc.  Volume, Urine-5HIAA                      2250                               mL    Unit: mL/24 h    Performed at [facility]  5-Hydroxyindoleacetic, Ur/day            3.2               0-15             mg/d    Reference range: <=6.0    Unit: mg/24 h  Additional Information  HL7 RESULT STATUS : F  External IF Update Timestamp : 2008-11-23:10:31:00.000000 -------------------------------------------------------------------------------------------------------  L-Aldosterone + Renin Activity w/ Ratio - STATUS: Final                                            Perform Date: 12Nov10 16:14  Ordered By: Sabino Snipes  ,             Ordered Date: 12Nov10 16:14                                       Last Updated Date: 84XLK44 13:07  Facility:  [facility]                              Department: GENL  Accession #: [id number]                  USN:       [id number]  Findings  Result Name                              Result     Abnl   Normal Range     Units      Perf. Loc.  Aldosterone                              <1    Oversized comment, see footnote  1  PRA LC/MS/MS                             0.57    Reference range: 0.25 to 5.82    Unit: ng/mL/h  ALDO / PRA Ratio                         1.8    Reference range: 0.9 to 28.9    Unit: Ratio  Footnotes  1. Unit: ng/dL     (NOTE)     Adult Reference Ranges for Aldosterone,     LC/MS/MS:     Upright 8:00-10:00 am    < or = 28 ng/dL     Upright  6:60-6:30 pm     < or = 21 ng/dL     Supine  1:60-10:93 am    3-16 ng/dL     Performed at [facility]  Additional Information  HL7 RESULT STATUS : F  External IF Update Timestamp : 2008-11-24:13:04:00.000000 ---------------------------------------------------------------------------------------------  L-Cortisol, Urinary Free 24 HR Urine - STATUS: Final                                            Perform Date: 23FTD32 13:30  Ordered By: Sabino Snipes  ,             Ordered Date: 12Nov10 14:05                                       Last Updated Date: 23Nov10 16:38  Facility: [facility]                              Department: GENL  Accession #: [id number]                  USN:       [id number]  Findings  Result Name                              Result     Abnl   Normal Range     Units      Perf. Loc.  Volume, Urine-CORTUR  2250                               mL  Cortisol, Urinary Free                   REPORT    Oversized comment, see footnote  1  Footnotes  1. (NOTE)     --------------TESTS-------------RESULTS--------UNITS--REF. RANGE---     Total Volume                     2250             mL     Cortisol, Free, Urine           665.5 H     mcg/24 h  4.0-50.0     Analysis performed by Tandem Mass Spectrometry     Creatinine, 24-Hour Urine        2.21         g/24 h  0.63-2.50     Performed at [facility]  Additional Information  HL7 RESULT STATUS : F  External IF Update Timestamp : 2008-11-28:16:35:00.000000 ----------------------------------------------------------------------------------------------  L-VMA, 24 Hr Urine - STATUS: Final                                            Perform Date: 16XWR60 13:30  Ordered By: Sabino Snipes  ,             Ordered Date: 45WUJ81 13:04                                       Last Updated Date: 24Nov10 12:13  Facility: [facility]                              Department: GENL  Accession #: [id number]                  USN:        [id number]  Findings  Result Name                              Result     Abnl   Normal Range     Units      Perf. Loc.  Vanillylmandelic Acid(VMA), Urine        (NOTE)    Oversized comment, see footnote  1  Volume, Urine-VMAUR                      2250                               mL  Footnotes  1. Test Procedure               Results       Units       Normal Range     Vanillylmandelic Acid (VMA)     Total Volume, Urine          2250          mL     VMA 24 Hr  Urine              9.2     H     mg/24hr     1.8-6.7     Performed at: [facility]     [address]     [place], [place]  [address]     [id number]  Additional Information  HL7 RESULT STATUS : F  External IF Update Timestamp : 2008-11-29:12:10:00.000000 ----------------------------------------------------------------------------------------------  L-Porphobilinogen, 24 HR Urine-Quant - STATUS: Final                                            Perform Date: 11Nov10 13:30  Ordered By: Delbert Harness  ,           Ordered Date: 12Nov10 00:36                                       Last Updated Date: 1 Dec10 10:20  Facility: [facility]                              Department: GENL  Accession #: [id number]                  USN:       [id number]  Findings  Result Name                              Result     Abnl   Normal Range     Units      Perf. Loc.  Total Volume - PORPQ                     2250    Unit: mL    Performed at [facility]  Porphobilinogen, Urine / day             0.0    Oversized comment, see footnote  1  Footnotes  1. Reference range: 0.0 to 2.7     Unit: mg/24 h     (NOTE)     Interpretive Guide:_______________________________     !                                 Elevated Urine  !     !                                Porphobilinogen  !     !      Porphyria                     Expected**   !     !                                                 !     !Acute intermittent  porphyria           +         !     !  ALA dehydratase deficiency             +         !     !     porphyria                                   !     !Congenital erythropoietic              -         !     !     coproporphyria                              !     !Erythropoietic protoporphyria          -         !     !Hepatoerythropoietic                   -         !     !     porphyria                                   !     !Hereditary coproporphyria             +/-        !     !Porphyria cutanea tarda                -         !     !Variegate porphyria                   +/-        !     !                                                 !     !** Patients with hereditary forms ofporphyria   !     !usually will present with profound elevations of !     !this analyte (> 5-fold) during acute episodes.   !     !Moderate elevations (< 3-fold) are more often due!     !to medications or environmental factors.         !     !_________________________________________________!  Additional Information  HL7 RESULT STATUS : F  External IF Update Timestamp : 2008-12-06:10:17:00.000000 ----------------------------------------------------------------------------------------------  L-Aminolevulinic Acid(ALA), 24 HR Ur - STATUS: Final                                            Perform Date: 16XWR60 13:30  Ordered By: Sabino Snipes  ,             Ordered Date: [phone] 13:05                                       Last Updated  Date: 1 Dec10 14:01  Facility: [facility]                              Department: GENL  Accession #: [id number]                    USN:       [id number]  Findings  Result Name                              Result     Abnl   Normal Range     Units      Perf. Loc.  Total Volume - ALA                       2250    Performed at [facility]  Creatinine, Urine mg/day-ALA             SEE NOTE.    2.048 g/24h H    (NOTE)    REFERENCE RANGE;              0.800-2.000  Aminolevulinic  Acid (ALA)-umol/day       15    Oversized comment, see footnote  1  Footnotes  1. (NOTE)     REFERENCE RANGE:          0.03-0.54     Delta ALA (mg/dL)   Relationship to Lead Exposure     -------------------------------------------------     0.00-0.54           None     0.55-1.49           Slight - Moderate     1.50-2.99           Heavy - Severe     >2.99               Critical  Additional Information  HL7 RESULT STATUS : F  External IF Update Timestamp : 2008-12-06:13:58:00.000000 ---------------------------------------------------------------------------------------------  CT Pelvis With Contrast - STATUS: Final  IMAGE                                     Perform Date: 6 Oct10 14:17  Ordered ByLupita Leash,         Ordered Date: 6 Oct10 09:57  Facility: [facility]                              Department: CT  Service Report Text  [facility] Accession Number: [id number]      Clinical Data:  Abdominal and epigastric pain.    CT ABDOMEN AND PELVIS WITH CONTRAST    Technique: Multidetector CT imaging of the abdomen and pelvis was   performed using the standard protocol following bolus   administration of intravenous contrast.    Contrast: 100 ml Omnipaque-300    Comparison: None    CT ABDOMEN    Findings:  Lung bases are clear.  No pleural or pericardial fluid.   There are two small cysts within the liver within the right lobe.   Anteriorly within the right lobe, there is a 1.4 cm region of lower   density that does not appear to represent a cyst.  This could   represent an area of  focal fatty change, a hemangioma, or an area   of diminished perfusion.  No calcified gallstones.  The spleen is   normal.  The pancreas is normal.  The adrenal glands are normal.   The left kidney is normal.  There is a 9 mm cyst in the upper pole   right kidney.  The aorta and IVC are normal.  No retroperitoneal   mass or adenopathy.  No free intraperitoneal fluid or air.  No   bowel pathology  is seen.  Contrast is passed into the colon.   Normal appearing appendix is visualized.    IMPRESSION:   No acute finding in the abdomen.  Small cysts of the liver and   right kidney.  1.4 cm low density area in the right lobe of the   liver anteriorly which is indeterminate.  This could represent an   area of focal fat or a hemangioma.  An area of diminished perfusion   and malignant liver mass are not excluded but are quite unlikely.    CT PELVIS    Findings:  No free fluid in the pelvis.  The bladder, prostate   gland and seminal vesicles are unremarkable.  No mass or   adenopathy.  No bowel pathology seen.    IMPRESSION:   Normal CT scan of the pelvis    Read By:  Thomasenia Sales,  M.D.   Released By:  Thomasenia Sales,  M.D.  Additional Information  HL7 RESULT STATUS : F  External image : [id number]  External IF Update Timestamp : 2008-10-11:14:28:30.000000

## 2010-02-07 NOTE — Miscellaneous (Signed)
Summary: charges update  Clinical Lists Changes  Orders: Added new Test order of Hospital Admit-[facility] (00000) - Signed

## 2010-02-07 NOTE — Progress Notes (Signed)
Summary: triage  Phone Note Call from Patient Call back at Home Phone [phone]   Caller: Patient Summary of Call: n/v - stomach pain/weakness Initial call taken by: De Nurse,  April 18, 2009 11:47 AM  Follow-up for Phone Call        left message Follow-up by: Golden Circle RN,  April 18, 2009 12:10 PM  Additional Follow-up for Phone Call Additional follow up Details #1::        started 4 days ago. not taking otc. no vomiting but lots of nausea. able to drink & eat. unable to get a ride until late tomorrow. will see Dr. Wallene Huh. told him we have a doctor on call when we are closed if he needed to speak to a md after hours Additional Follow-up by: Golden Circle RN,  April 18, 2009 1:34 PM
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
## 2010-02-07 NOTE — Assessment & Plan Note (Signed)
Summary: h/fup,tcb   Vital Signs:  Patient profile:   20 year old male Weight:      145.8 pounds Pulse rate:   85 / minute BP sitting:   124 / 80  (right arm)  Vitals Entered By: Arlyss Repress CMA, (October 24, 2008 11:32 AM) CC: hospital f/up. arm pain from IV's in Hospital Is Patient Diabetic? No Pain Assessment Patient in pain? yes     Location: arms Intensity: 7 Onset of pain  x 1week   Primary Care Provider:  Helane Rima DO  CC:  hospital f/up. arm pain from IV's in Hospital.  History of Present Illness: 20 year old AAM presenting for hospital follow up. Discussed case with Dr. Gwendolyn Grant, primary doctor while in hospital. Please see Discharge Summary (from 10/20/08) for more details, including labs and procedures.  1. Nausea/Vomiting/Abdominal Pain secondary to GE with gastritis, + candida: hospital stay > 1 week. once patient tolerated by mouth intake, he was sent home. Rx upon DC included zofran, fluconazole, reglan, protonix two times a day. he finished the fluconazole and has not needed the zofran. he is still taking the protonix and reglan as directed. states that he has not had any nausea/vomiting/abdominal pain since leaving the hospital. he is always hungry and is eating "a lot." he has no scheduled GI f/u. needs a note for school.   2. Arm Pain secondary to IV sites: bruises healing, no swelling, FROM, no signs of infection.  Habits & Providers  Alcohol-Tobacco-Diet     Tobacco Status: current     Tobacco Counseling: to quit use of tobacco products  Current Medications (verified): 1)  Zofran 4 Mg Tabs (Ondansetron Hcl) .... Take One Tablet Every 4 Hours As Needed For Nausea 2)  Protonix 40 Mg  Tbec (Pantoprazole Sodium) .... Once Daily 3)  Reglan 5 Mg Tabs (Metoclopramide Hcl) .... One By Mouth Qid  Allergies (verified): No Known Drug Allergies  Past History:  Past Medical History: pes planus w/ occasional achilles tendonitis hospitalization for  intractable nausea/vomiting/abd pain secondary to GE, gastritis, + candida PMH-FH-SH reviewed-no changes except otherwise noted  Family History: ca lung, htn mother with cutaneous sarcoidosis  Social History: Smoking Status:  current  Review of Systems General:  Denies chills, fever, loss of appetite, and malaise. CV:  Denies swelling of feet and swelling of hands. Resp:  Denies chest discomfort and cough. GI:  Denies abdominal pain, constipation, diarrhea, gas, indigestion, loss of appetite, nausea, and vomiting. Derm:  Complains of lesion(s); denies poor wound healing and rash. Psych:  Denies depression. Heme:  Denies abnormal bruising, bleeding, and fevers.  Physical Exam  General:  Well-developed, well-nourished, in no acute distress; alert, appropriate and cooperative throughout examination. Vitals reviewed. Lungs:  Normal respiratory effort, chest expands symmetrically. Lungs are clear to auscultation, no crackles or wheezes. Heart:  Normal rate and regular rhythm. S1 and S2 normal without gallop, murmur, click, rub or other extra sounds. Abdomen:  soft, non-tender, normal bowel sounds, no guarding, and no hepatomegaly.   Pulses:  2+ dp upper extremities. Extremities:  no edema Neurologic:  alert & oriented X3, cranial nerves II-XII intact, and strength normal in all extremities.   Skin:  healing bruises centered around old IV sites on both arms. no evidence of DVT. no swelling. normal pulses.  Psych:  Oriented X3, memory intact for recent and remote, normally interactive, good eye contact, not anxious appearing, and not depressed appearing.     Impression & Recommendations:  Problem #  1:  GASTRITIS, ACUTE, WITHOUT HEMORRHAGE (ICD-535.00) Assessment Improved  RESOLVED. Rec: decreasing the Protonix to once daily, may stop medication in 3 weeks. stop Reglan in 3 days with precautions. He finished the Fluconazole and has not needed the Zofran since coming home. no follow up  needed with GI.  His updated medication list for this problem includes:    Protonix 40 Mg Tbec (Pantoprazole sodium) ..... Once daily  Orders: [facility]- Est Level  3 ([address])  Problem # 2:  BRUISE (ICD-924.9) Assessment: Improved  All healing normally. Gave red flags.  Orders: [facility]- Est Level  3 ([address])  Problem # 3:  TOBACCO USER (ICD-305.1) Assessment: Unchanged Advised to quit.  Complete Medication List: 1)  Zofran 4 Mg Tabs (Ondansetron hcl) .... Take one tablet every 4 hours as needed for nausea 2)  Protonix 40 Mg Tbec (Pantoprazole sodium) .... Once daily 3)  Reglan 5 Mg Tabs (Metoclopramide hcl) .... One by mouth qid  Patient Instructions: 1)  I'm glad that you are feeling so well! 2)  Take the Protonix daily now until your bottle runs out. Aviod Motrin, Alleve, and other over-the-counter anti-inflammatory medications that can hurt your stomach. Take Tylenol for your pain. 3)  Stop the Reglan in 3 days. If you start to have nausea and vomiting after that, restart the medication and call me. Prescriptions: PROTONIX 40 MG  TBEC (PANTOPRAZOLE SODIUM) once daily  #0 x 0   Entered and Authorized by:   Helane Rima DO   Signed by:   Helane Rima DO on 10/24/2008   Method used:   Historical   RxID:   [id number]   Prevention & Chronic Care Immunizations   Influenza vaccine: Fluvax Non-MCR  (10/18/2007)    Tetanus booster: Not documented    Pneumococcal vaccine: Not documented  Other Screening   Smoking status: current  (10/24/2008)

## 2010-02-07 NOTE — Initial Assessments (Signed)
 Summary: Admit H&P  R2 Admit H&P PCP: Prentiss CC: Nausea/vomiting HPI: Samuel Robertson no PMhx with approx 10d N/V.  Went to [employer] prior to developing N/V, ate at [employer] but otherwise no known exposures to contaminated food.  Developed nausea and epigastric pain on the way to school in the car.  Pain is constant, epigastric, worse with palpation, no sig association with food, associated with nausea and vomiting.  No radiation, moderate severity, getting worse per pt.  Multiple episodes of vomiting daily, last episode earlier today.  Vomitus is greenish at times, no blood, some mucus.  Pt is able to keep down small amounts of food in between vomiting.  No diarrhea, no constipation, stooling unchanged.  Pt seen at [facility] end of september, given zofran  and dx with viral syndrome.  CT done on 10/6, normal except for hypodensity in liver, K was low.  Pt returned to ED today, 10/8 after no resolution of symptoms.  Admits to fevers/chills, body aches, no change in color of urine, mother feels that his eyes may be more yellow.  No sick contacts.  No rashes, no new bug bites.    ROS:  see HPI  PMHx: neg SurgHx:  Eye surgery as a child. Social Hx:  Consulting Civil Engineer at [employer], studying business, No alcohol tobacco, drugs. No international travel. FamHx:  Negative for GI diseases. Allergies: None Meds: none Exam: Vitals: 140s-150s/90s, 60, 18, 98.2, 100% RA General: AAOx3, NAD, WD/WN HEENT: [place]/AT, PERRL, EOMI, slight scleral icterus, Oral mucosa and nasal mucosae normal, Neck supple with shotty LAD. Cardiopulm: RRR, no MRG, CTAB Abd: Soft, TTP epigastrium, no organomegaly, no rebounding, normal BS, murphy's negative.  Non distended. Ext: Normal, no edema, no rashes.  Labs:  CBC: 5.1>14.6/42.7<241 N58% CMET: 140/3.1/109/22/9/0.92<109 TBili:1.8 Alk, AST, ALT: WNL Lipase:19 CT abd/pelv (10/11/08) Normal, small hypodensity in liver, pole of right kidney. Abd US : Neg  A/P: Samuel Robertson with 10 days N/V 1:  N/V: Likely 2/2 viral syndrome, doubt bacterial GE 2/2 duration.  No diarrhea so unlikely salmonella/shigella/campylobacter, signs of LAD make viral etiology more likely.  Replete fluid losses with D51/2NS @200cc /h, Zofran  as needed, tylenol as needed abd pain. Will check Hpylori, Acute hepatitis panel, stool O&P, monospot as well. 2: Hypokalemia:  Repleted with 40meq by mouth x1 in ED.  Will recheck CMET in Am 3: Fen/GI:  Fluids as above, clear liq diet.   4: PPx: Protonix  40mg  by mouth once daily. Heparin TID 5: Dispo:  Pending resolution of N/V, repletion of K.

## 2010-02-09 ENCOUNTER — Emergency Department (HOSPITAL_COMMUNITY)
Admission: EM | Admit: 2010-02-09 | Discharge: 2010-02-10 | Disposition: A | Discharge status: Home / Self Care | Payer: BC Managed Care – PPO | Attending: Emergency Medicine | Admitting: Emergency Medicine

## 2010-02-09 DIAGNOSIS — R112 Nausea with vomiting, unspecified: Secondary | ICD-10-CM | POA: Insufficient documentation

## 2010-02-09 DIAGNOSIS — I10 Essential (primary) hypertension: Secondary | ICD-10-CM | POA: Insufficient documentation

## 2010-02-09 LAB — DIFFERENTIAL
Basophils Absolute: 0 10*3/uL (ref 0.0–0.1)
Eosinophils Relative: 0 % (ref 0–5)
Lymphocytes Relative: 36 % (ref 12–46)
Lymphs Abs: 2.3 10*3/uL (ref 0.7–4.0)
Neutro Abs: 3.4 10*3/uL (ref 1.7–7.7)

## 2010-02-09 LAB — POCT I-STAT, CHEM 8
BUN: 17 mg/dL (ref 6–23)
Creatinine, Ser: 1.1 mg/dL (ref 0.4–1.5)
Glucose, Bld: 102 mg/dL — ABNORMAL HIGH (ref 70–99)
Hemoglobin: 18 g/dL — ABNORMAL HIGH (ref 13.0–17.0)
Potassium: 3.1 mEq/L — ABNORMAL LOW (ref 3.5–5.1)
Sodium: 140 mEq/L (ref 135–145)
TCO2: 25 mmol/L (ref 0–100)

## 2010-02-09 LAB — CBC
HCT: 49.5 % (ref 39.0–52.0)
MCV: 85.6 fL (ref 78.0–100.0)
RDW: 12.1 % (ref 11.5–15.5)
WBC: 6.5 10*3/uL (ref 4.0–10.5)

## 2010-02-10 LAB — URINALYSIS, ROUTINE W REFLEX MICROSCOPIC
Specific Gravity, Urine: 1.025 (ref 1.005–1.030)
Urine Glucose, Fasting: NEGATIVE mg/dL
pH: 7 (ref 5.0–8.0)

## 2010-02-11 DIAGNOSIS — Z8719 Personal history of other diseases of the digestive system: Secondary | ICD-10-CM

## 2010-02-11 DIAGNOSIS — F329 Major depressive disorder, single episode, unspecified: Secondary | ICD-10-CM

## 2010-02-12 ENCOUNTER — Encounter: Payer: Self-pay | Admitting: Family Medicine

## 2010-02-12 ENCOUNTER — Ambulatory Visit (INDEPENDENT_AMBULATORY_CARE_PROVIDER_SITE_OTHER): Payer: BC Managed Care – PPO | Admitting: Family Medicine

## 2010-02-12 ENCOUNTER — Encounter: Payer: Self-pay | Admitting: *Deleted

## 2010-02-21 NOTE — Letter (Signed)
Summary: Out of Work  [facility]  [address]   [place], [place] [address]   Phone: [phone]  Fax: [phone]    February 12, 2010   Employee:  Samuel Robertson    To Whom It May Concern:   For Medical reasons, please excuse the above named employee from work for the following dates:  Start:   02/06/10  End:   02/13/10  If you need additional information, please feel free to contact our office.         Sincerely,    Luretha Murphy NP

## 2010-02-21 NOTE — Assessment & Plan Note (Signed)
Summary: n/v/congestion,df   Vital Signs:  Patient profile:   20 year old male Weight:      138.7 pounds Temp:     98 degrees F oral Pulse rate:   81 / minute BP sitting:   120 / 80  (left arm) Cuff size:   regular  Vitals Entered By: Renato Battles slade,cma CC: N/V/D X > 1 WEEK. Is Patient Diabetic? No Pain Assessment Patient in pain? no        Primary Care Provider:  Helane Rima DO  CC:  N/V/D X > 1 WEEK..  History of Present Illness: Bout of nausea and vomiting that started last week, missed his first day of a new job 2/1 and has not gone back.  Admitted in October for same, had a brief event on Christmas Day.  He thought is may have been food as his Mother also was sick but her illness resolved in a few days.  He visiting the ER on 2/5.   Today feeling better.  Endorses feelings of depression and insomnia. Denies use of alcohol or marijauna.  Habits & Providers  Alcohol-Tobacco-Diet     Tobacco Status: current     Tobacco Counseling: to quit use of tobacco products  Current Medications (verified): 1)  Protonix 40 Mg  Tbec (Pantoprazole Sodium) .... Once Daily 2)  Propranolol Hcl 40 Mg Tabs (Propranolol Hcl) .... One By Mouth Two Times A Day 3)  Promethazine Hcl 25 Mg Tabs (Promethazine Hcl) .... One By Mouth Q 6 Hours As Needed Vomiting 4)  Ondansetron Hcl 8 Mg Tabs (Ondansetron Hcl) .... One Tab Three Times A Day As Needed Nausea 5)  Mirtazapine 15 Mg Tabs (Mirtazapine) .... One Tab At Bedtime  Allergies: No Known Drug Allergies  Social History: Smoking Status:  current  Review of Systems       The patient complains of anorexia.  The patient denies fever, weight loss, abdominal pain, and severe indigestion/heartburn.    Physical Exam  General:  Alert, depressed, thin appearing   Impression & Recommendations:  Problem # 1:  VOMITING, PERSISTENT, HX OF (ICD-V12.79) discussion for 20 minutes, he agreed to resume Remeron, he used in the past.  May help with  sleep and weight gain and treat the depression.  He will return to see MD in 6 weeks to assess efficiacy. Orders: [facility]- Est Level  2 ([address])  Problem # 2:  DEPRESSION (ICD-311) seem to be a large part of he problem but have only seen him twice. His updated medication list for this problem includes:    Mirtazapine 15 Mg Tabs (Mirtazapine) ..... One tab at bedtime  Orders: [facility]- Est Level  2 ([address])  Complete Medication List: 1)  Protonix 40 Mg Tbec (Pantoprazole sodium) .... Once daily 2)  Propranolol Hcl 40 Mg Tabs (Propranolol hcl) .... One by mouth two times a day 3)  Promethazine Hcl 25 Mg Tabs (Promethazine hcl) .... One by mouth q 6 hours as needed vomiting 4)  Ondansetron Hcl 8 Mg Tabs (Ondansetron hcl) .... One tab three times a day as needed nausea 5)  Mirtazapine 15 Mg Tabs (Mirtazapine) .... One tab at bedtime  Patient Instructions: 1)  Trial of Remeron to help with sleep, appetite, and weight gain, give it a good  6 weeks 2)  Return in 6 weeks with Dr. Earlene Plater  Prescriptions: MIRTAZAPINE 15 MG TABS (MIRTAZAPINE) one tab at bedtime  #30 x 1   Entered and Authorized by:   Luretha Murphy NP  Signed by:   Luretha Murphy NP on 02/12/2010   Method used:   Electronically to        [facility] [address] (retail)       [address]       [place], [place]  [id number]       Ph: [phone]       Fax: [phone]   RxID:   [id number]    Orders Added: 1)  [facility]- Est Level  2 [[address]]

## 2010-02-21 NOTE — Letter (Signed)
Summary: Out of School  [facility]  [address]   [place], [place] [address]   Phone: [phone]  Fax: [phone]    February 12, 2010   Student:  Samuel Robertson    To Whom It May Concern:   For Medical reasons, please excuse the above named student from school for the following dates:  Had to drop out of scholl in Fall of 2011 for chronic health problems requiring admission to the hospital.   If you need additional information, please feel free to contact our office.   Sincerely,    Luretha Murphy NP    ****This is a legal document and cannot be tampered with.  Schools are authorized to verify all information and to do so accordingly.

## 2010-02-27 NOTE — Discharge Summary (Signed)
NAMENICHOLAS, TROMPETER NO.:  [id number]  MEDICAL RECORD NO.:  [id number]          PATIENT TYPE:  INP  LOCATION:  6710                         FACILITY:  [facility]  PHYSICIAN:  Keita Demarco [place], MD       DATE OF BIRTH:  [birth year]  DATE OF ADMISSION:  11/01/2009 DATE OF DISCHARGE:  11/02/2009                              DISCHARGE SUMMARY   PRIMARY CARE PROVIDER:  Helane Rima, MD, at [facility].  DISCHARGE DIAGNOSES: 1. Nausea and vomiting. 2. Hypokalemia. 3. Hypertension.  DISCHARGE MEDICATIONS: 1. Promethazine 25 mg 1 tablet p.o. q.6 h. as needed for vomiting. 2. Propranolol 40 mg 1 tablet p.o. b.i.d. 3. Protonix 40 mg 1 tablet p.o. daily. 4. Zofran 8 mg 1 tablet p.o. t.i.d. p.r.n. for nausea.  PROCEDURES:  No procedures done during the hospitalization.  LABORATORY DATA:  Initially when he first came in, his potassium was 3.1.  At time of discharge, labs were CBC with WBCs of 8.2, hemoglobin of 13.9, hematocrit of 40.1, and platelets of 229,000.  BMET with sodium 139, potassium 3.3, chloride of 104, bicarb 27, BUN of 3, creatinine of 0.68, and glucose of 105.  BRIEF HOSPITAL COURSE:  This is a 20 year old with history of intractable vomiting requiring hospitalization in the past who came in with vomiting x2 days. 1. Nausea and vomiting.  The patient has been admitted for this     problem before.  The patient was admitted and treated with IV     fluids and Zofran.  He was able to tolerate clear liquids this     morning and was advanced to regular food and lunch.  He tolerated     this well as he ate his entire tray full of food without any     further nausea or vomiting.  The cause of his vomiting have been     elicited; however, there were multiple labs that were done during     his admission last year for possible pheochromocytoma or a type of     porphyria.  Unsure if these labs were ever followed up in the     clinic.  Recommend  he have this followed up by his PCP. 2. Hypokalemia.  The patient with history of hypokalemia.  Potassium     of 3.1 on admission, received 40 mEq of potassium with first IV     bolus.  He was continued on D5 half-normal saline with 28 of K     throughout the admission.  His potassium had improved slightly to     3.3 at time of discharge.  Recommend recheck of this at followup     appointment.  May want to also check a magnesium level at that     time. 3. Hypertension.  Initially when the patient was admitted to hospital,     blood pressure 165/121.  He was given hydralazine IV as needed for     elevated blood pressure.  He tolerated this well and pressures came     down.  He was able to be transitioned back over to his propranolol  that he was taking p.o.  At time of discharge, blood pressure was     138/97.  DISCHARGE INSTRUCTIONS:  The patient will follow up on November 08, 2009, with Dr. Earlene Plater at 2:30 p.m.  The patient understands that if he cannot tolerate anything by mouth and become dehydrated, he is to return to the emergency department or call the clinic.  FOLLOWUP ISSUES:  Recommend follow up of labs that were done last year during his admission.  Also recommend follow up of his potassium and check a magnesium level at time of follow up.  DISCHARGE CONDITION:  The patient is discharged home in stable medical condition.    ______________________________ Everrett Coombe, MD   ______________________________ Venesa Semidey [place], MD    CM/MEDQ  D:  11/02/2009  T:  11/03/2009  Job:  [id number]  Electronically Signed by Everrett Coombe MD on 11/04/2009 03:07:47 PM Electronically Signed by Maddy Graham [place]  on 02/27/2010 04:36:22 PM MedRecNo: [id number] [facility], Account: [id number], DocSeq: [id number] Should read: "Cause of his vomiting is not clear" Electronically Signed by Emma Birchler [place]  on 02/27/2010 04:36:15 PM

## 2010-03-20 LAB — CBC
HCT: 40.1 % (ref 39.0–52.0)
Hemoglobin: 13.9 g/dL (ref 13.0–17.0)
MCH: 29.5 pg (ref 26.0–34.0)
MCV: 85.1 fL (ref 78.0–100.0)
Platelets: 229 10*3/uL (ref 150–400)
RBC: 4.71 MIL/uL (ref 4.22–5.81)
WBC: 8.2 10*3/uL (ref 4.0–10.5)

## 2010-03-20 LAB — BASIC METABOLIC PANEL
Calcium: 9.6 mg/dL (ref 8.4–10.5)
Chloride: 104 mEq/L (ref 96–112)
Potassium: 3.3 mEq/L — ABNORMAL LOW (ref 3.5–5.1)

## 2010-03-20 LAB — MAGNESIUM: Magnesium: 2.1 mg/dL (ref 1.5–2.5)

## 2010-03-24 ENCOUNTER — Emergency Department (HOSPITAL_COMMUNITY)
Admission: EM | Admit: 2010-03-24 | Discharge: 2010-03-25 | Disposition: A | Discharge status: Home / Self Care | Payer: BC Managed Care – PPO | Attending: Emergency Medicine | Admitting: Emergency Medicine

## 2010-03-24 DIAGNOSIS — R10816 Epigastric abdominal tenderness: Secondary | ICD-10-CM | POA: Insufficient documentation

## 2010-03-24 DIAGNOSIS — R1115 Cyclical vomiting syndrome unrelated to migraine: Secondary | ICD-10-CM | POA: Insufficient documentation

## 2010-03-24 DIAGNOSIS — R509 Fever, unspecified: Secondary | ICD-10-CM | POA: Insufficient documentation

## 2010-03-24 DIAGNOSIS — I10 Essential (primary) hypertension: Secondary | ICD-10-CM | POA: Insufficient documentation

## 2010-03-24 DIAGNOSIS — Z79899 Other long term (current) drug therapy: Secondary | ICD-10-CM | POA: Insufficient documentation

## 2010-03-24 LAB — POCT I-STAT, CHEM 8
BUN: <3 mg/dL — ABNORMAL LOW (ref 6–23)
Calcium, Ion: 1.01 mmol/L — ABNORMAL LOW (ref 1.12–1.32)
Chloride: 101 mEq/L (ref 96–112)
Creatinine, Ser: 0.8 mg/dL (ref 0.4–1.5)
Glucose, Bld: 127 mg/dL — ABNORMAL HIGH (ref 70–99)
HCT: 45 % (ref 39.0–52.0)
Hemoglobin: 15.3 g/dL (ref 13.0–17.0)
Potassium: 3.3 mEq/L — ABNORMAL LOW (ref 3.5–5.1)
TCO2: 30 mmol/L (ref 0–100)

## 2010-03-25 LAB — COMPREHENSIVE METABOLIC PANEL
ALT: 17 U/L (ref 0–53)
AST: 18 U/L (ref 0–37)
Alkaline Phosphatase: 66 U/L (ref 39–117)
CO2: 29 mEq/L (ref 19–32)
Calcium: 9.5 mg/dL (ref 8.4–10.5)
Chloride: 99 mEq/L (ref 96–112)
Creatinine, Ser: 0.93 mg/dL (ref 0.4–1.5)
GFR calc Af Amer: >60 mL/min (ref >60)
Glucose, Bld: 125 mg/dL — ABNORMAL HIGH (ref 70–99)
Sodium: 138 mEq/L (ref 135–145)
Total Protein: 8.5 g/dL — ABNORMAL HIGH (ref 6.0–8.3)

## 2010-03-25 LAB — CBC
HCT: 50.2 % (ref 39.0–52.0)
MCHC: 34.7 g/dL (ref 30.0–36.0)
Platelets: 263 10*3/uL (ref 150–400)
WBC: 6.9 10*3/uL (ref 4.0–10.5)

## 2010-03-25 LAB — DIFFERENTIAL
Eosinophils Absolute: 0 10*3/uL (ref 0.0–0.7)
Lymphs Abs: 2 10*3/uL (ref 0.7–4.0)
Monocytes Absolute: 0.6 10*3/uL (ref 0.1–1.0)

## 2010-03-25 LAB — LIPASE, BLOOD: Lipase: 16 U/L (ref 11–59)

## 2010-04-10 LAB — MAGNESIUM: Magnesium: 2.3 mg/dL (ref 1.5–2.5)

## 2010-04-10 LAB — CBC
HCT: 44.9 % (ref 39.0–52.0)
HCT: 46.8 % (ref 39.0–52.0)
HCT: 47.2 % (ref 39.0–52.0)
Hemoglobin: 15.7 g/dL (ref 13.0–17.0)
MCHC: 34.9 g/dL (ref 30.0–36.0)
MCHC: 33.8 g/dL (ref 30.0–36.0)
MCV: 88 fL (ref 78.0–100.0)
MCV: 88.2 fL (ref 78.0–100.0)
MCV: 89.2 fL (ref 78.0–100.0)
MCV: 88.6 fL (ref 78.0–100.0)
Platelets: 282 10*3/uL (ref 150–400)
Platelets: 296 10*3/uL (ref 150–400)
Platelets: 267 10*3/uL (ref 150–400)
RBC: 5.1 MIL/uL (ref 4.22–5.81)
RDW: 12.9 % (ref 11.5–15.5)
RDW: 13.6 % (ref 11.5–15.5)
WBC: 6.6 10*3/uL (ref 4.0–10.5)

## 2010-04-10 LAB — DRUGS OF ABUSE SCREEN W/O ALC, ROUTINE URINE
Benzodiazepines.: NEGATIVE
Cocaine Metabolites: NEGATIVE
Marijuana Metabolite: NEGATIVE
Methadone: NEGATIVE
Opiate Screen, Urine: NEGATIVE
Propoxyphene: NEGATIVE

## 2010-04-10 LAB — PORPHYRINS, FRACTIONATED URINE (TIMED COLLECTION)
Coproporphyrin III: 10 umol/mol CRT (ref 0–14)
Coproporphyrin, U: NO GROWTH
Heptacarboxyl Porphyrin, 24H Ur: 0 umol/mol CRT (ref 0–2)
Porphyrins Interpretation: NORMAL
Volume, Urine-PORPF: 2250

## 2010-04-10 LAB — COMPREHENSIVE METABOLIC PANEL
AST: 15 U/L (ref 0–37)
Albumin: 3.9 g/dL (ref 3.5–5.2)
Albumin: 4.4 g/dL (ref 3.5–5.2)
Albumin: 4.9 g/dL (ref 3.5–5.2)
BUN: 9 mg/dL (ref 6–23)
BUN: 11 mg/dL (ref 6–23)
Calcium: 9.6 mg/dL (ref 8.4–10.5)
Chloride: 104 mEq/L (ref 96–112)
Creatinine, Ser: 0.73 mg/dL (ref 0.4–1.5)
Creatinine, Ser: 0.77 mg/dL (ref 0.4–1.5)
Creatinine, Ser: 0.78 mg/dL (ref 0.4–1.5)
GFR calc Af Amer: >60 mL/min (ref >60)
Glucose, Bld: 70 mg/dL (ref 70–99)
Total Bilirubin: 1.3 mg/dL — ABNORMAL HIGH (ref 0.3–1.2)
Total Protein: 7.3 g/dL (ref 6.0–8.3)
Total Protein: 7.8 g/dL (ref 6.0–8.3)
Total Protein: 8.9 g/dL — ABNORMAL HIGH (ref 6.0–8.3)

## 2010-04-10 LAB — DIFFERENTIAL
Basophils Relative: 0 % (ref 0–1)
Basophils Relative: 0 % (ref 0–1)
Eosinophils Relative: 0 % (ref 0–5)
Eosinophils Relative: 0 % (ref 0–5)
Lymphocytes Relative: 16 % (ref 12–46)
Monocytes Absolute: 0.7 10*3/uL (ref 0.1–1.0)
Monocytes Relative: 9 % (ref 3–12)
Monocytes Relative: 10 % (ref 3–12)
Neutro Abs: 5 10*3/uL (ref 1.7–7.7)
Neutro Abs: 4.8 10*3/uL (ref 1.7–7.7)
Neutrophils Relative %: 74 % (ref 43–77)

## 2010-04-10 LAB — POCT I-STAT, CHEM 8
BUN: 11 mg/dL (ref 6–23)
Calcium, Ion: 1.14 mmol/L (ref 1.12–1.32)
Chloride: 101 mEq/L (ref 96–112)
Creatinine, Ser: 0.7 mg/dL (ref 0.4–1.5)
Glucose, Bld: 110 mg/dL — ABNORMAL HIGH (ref 70–99)

## 2010-04-10 LAB — BASIC METABOLIC PANEL
BUN: 10 mg/dL (ref 6–23)
BUN: 6 mg/dL (ref 6–23)
BUN: 7 mg/dL (ref 6–23)
CO2: 21 mEq/L (ref 19–32)
CO2: 24 mEq/L (ref 19–32)
Calcium: 9.4 mg/dL (ref 8.4–10.5)
Calcium: 9.7 mg/dL (ref 8.4–10.5)
Chloride: 102 mEq/L (ref 96–112)
Chloride: 102 mEq/L (ref 96–112)
Chloride: 102 mEq/L (ref 96–112)
Chloride: 104 mEq/L (ref 96–112)
Creatinine, Ser: 0.73 mg/dL (ref 0.4–1.5)
Creatinine, Ser: 0.76 mg/dL (ref 0.4–1.5)
Creatinine, Ser: 0.79 mg/dL (ref 0.4–1.5)
GFR calc Af Amer: >60 mL/min (ref >60)
GFR calc Af Amer: >60 mL/min (ref >60)
GFR calc non Af Amer: >60 mL/min (ref >60)
GFR calc non Af Amer: >60 mL/min (ref >60)
Glucose, Bld: 102 mg/dL — ABNORMAL HIGH (ref 70–99)
Glucose, Bld: 105 mg/dL — ABNORMAL HIGH (ref 70–99)
Potassium: 2.9 mEq/L — ABNORMAL LOW (ref 3.5–5.1)
Potassium: 3.2 mEq/L — ABNORMAL LOW (ref 3.5–5.1)
Sodium: 136 mEq/L (ref 135–145)

## 2010-04-10 LAB — PHOSPHORUS: Phosphorus: 3 mg/dL (ref 2.3–4.6)

## 2010-04-10 LAB — URINALYSIS, MICROSCOPIC ONLY
Bilirubin Urine: NEGATIVE
Glucose, UA: NEGATIVE mg/dL
Hgb urine dipstick: NEGATIVE
Ketones, ur: 40 mg/dL — AB
Protein, ur: NEGATIVE mg/dL
Urobilinogen, UA: 1 mg/dL (ref 0.0–1.0)

## 2010-04-10 LAB — TSH: TSH: 2.521 u[IU]/mL (ref 0.700–6.400)

## 2010-04-10 LAB — PORPHOBILINOGEN, 24 HR URINE-QUANT: Porphobilinogen, 24H Ur: 0 mg/24 h (ref 0.0–2.7)

## 2010-04-10 LAB — ALDOSTERONE + RENIN ACTIVITY W/ RATIO
ALDO / PRA Ratio: 1.8 Ratio (ref 0.9–28.9)
PRA LC/MS/MS: 0.57 ng/mL/h (ref 0.25–5.82)

## 2010-04-10 LAB — AMINOLEVULINIC ACID, 24 HOUR
Creatinine, Urine mg/day-ALA: 2.048
Total Volume - ALA: 2250

## 2010-04-10 LAB — 5 HIAA, QUANTITATIVE, URINE, 24 HOUR: 5-HIAA, 24 Hr Urine: 3.2 mg/24 h (ref <=6.0)

## 2010-04-11 LAB — POCT I-STAT, CHEM 8
BUN: 12 mg/dL (ref 6–23)
Calcium, Ion: 0.97 mmol/L — ABNORMAL LOW (ref 1.12–1.32)
Chloride: 114 mEq/L — ABNORMAL HIGH (ref 96–112)
Glucose, Bld: 121 mg/dL — ABNORMAL HIGH (ref 70–99)
TCO2: 19 mmol/L (ref 0–100)

## 2010-04-11 LAB — COMPREHENSIVE METABOLIC PANEL WITH GFR
ALT: 10 U/L (ref 0–53)
AST: 13 U/L (ref 0–37)
Alkaline Phosphatase: 62 U/L (ref 39–117)
CO2: 26 meq/L (ref 19–32)
Calcium: 9.1 mg/dL (ref 8.4–10.5)
Chloride: 104 meq/L (ref 96–112)
GFR calc Af Amer: >60 mL/min (ref >60)
GFR calc non Af Amer: >60 mL/min (ref >60)
Glucose, Bld: 128 mg/dL — ABNORMAL HIGH (ref 70–99)
Sodium: 136 meq/L (ref 135–145)
Total Bilirubin: 1.8 mg/dL — ABNORMAL HIGH (ref 0.3–1.2)

## 2010-04-11 LAB — HEPATIC FUNCTION PANEL
ALT: 17 U/L (ref 0–53)
AST: 18 U/L (ref 0–37)
Bilirubin, Direct: 0.3 mg/dL (ref 0.0–0.3)
Bilirubin, Direct: 0.3 mg/dL (ref 0.0–0.3)
Indirect Bilirubin: 0.9 mg/dL (ref 0.3–0.9)
Indirect Bilirubin: 1 mg/dL — ABNORMAL HIGH (ref 0.3–0.9)
Total Bilirubin: 1.2 mg/dL (ref 0.3–1.2)

## 2010-04-11 LAB — HEPATITIS PANEL, ACUTE
HCV Ab: NEGATIVE
Hep A IgM: NEGATIVE
Hep B C IgM: NEGATIVE
Hepatitis B Surface Ag: NEGATIVE

## 2010-04-11 LAB — CBC
HCT: 42.7 % (ref 39.0–52.0)
HCT: 43.4 % (ref 39.0–52.0)
HCT: 44.2 % (ref 39.0–52.0)
HCT: 44.9 % (ref 39.0–52.0)
HCT: 45 % (ref 39.0–52.0)
HCT: 46.1 % (ref 39.0–52.0)
Hemoglobin: 14.6 g/dL (ref 13.0–17.0)
Hemoglobin: 14.7 g/dL (ref 13.0–17.0)
Hemoglobin: 15.6 g/dL (ref 13.0–17.0)
Hemoglobin: 16 g/dL (ref 13.0–17.0)
MCHC: 33.8 g/dL (ref 30.0–36.0)
MCHC: 34 g/dL (ref 30.0–36.0)
MCHC: 34.2 g/dL (ref 30.0–36.0)
MCV: 88.3 fL (ref 78.0–100.0)
MCV: 88.6 fL (ref 78.0–100.0)
MCV: 88.7 fL (ref 78.0–100.0)
MCV: 88.7 fL (ref 78.0–100.0)
MCV: 89 fL (ref 78.0–100.0)
MCV: 89.3 fL (ref 78.0–100.0)
Platelets: 215 10*3/uL (ref 150–400)
Platelets: 241 10*3/uL (ref 150–400)
Platelets: 249 10*3/uL (ref 150–400)
Platelets: 251 10*3/uL (ref 150–400)
Platelets: 253 10*3/uL (ref 150–400)
Platelets: 262 10*3/uL (ref 150–400)
RBC: 4.83 MIL/uL (ref 4.22–5.81)
RBC: 4.88 MIL/uL (ref 4.22–5.81)
RBC: 5.09 MIL/uL (ref 4.22–5.81)
RDW: 12.9 % (ref 11.5–15.5)
RDW: 12.9 % (ref 11.5–15.5)
RDW: 13 % (ref 11.5–15.5)
RDW: 13.6 % (ref 11.5–15.5)
WBC: 5.3 K/uL (ref 4.0–10.5)
WBC: 5.8 10*3/uL (ref 4.0–10.5)
WBC: 6.2 10*3/uL (ref 4.0–10.5)
WBC: 7.1 10*3/uL (ref 4.0–10.5)
WBC: 7.3 10*3/uL (ref 4.0–10.5)
WBC: 7.7 10*3/uL (ref 4.0–10.5)

## 2010-04-11 LAB — COMPREHENSIVE METABOLIC PANEL
AST: 23 U/L (ref 0–37)
Albumin: 4.2 g/dL (ref 3.5–5.2)
Albumin: 4.4 g/dL (ref 3.5–5.2)
Albumin: 4.8 g/dL (ref 3.5–5.2)
Alkaline Phosphatase: 68 U/L (ref 39–117)
BUN: 12 mg/dL (ref 6–23)
BUN: 5 mg/dL — ABNORMAL LOW (ref 6–23)
BUN: 9 mg/dL (ref 6–23)
CO2: 19 mEq/L (ref 19–32)
Calcium: 9 mg/dL (ref 8.4–10.5)
Chloride: 110 mEq/L (ref 96–112)
Creatinine, Ser: 0.9 mg/dL (ref 0.4–1.5)
Creatinine, Ser: 0.92 mg/dL (ref 0.4–1.5)
Creatinine, Ser: 1.02 mg/dL (ref 0.4–1.5)
GFR calc Af Amer: >60 mL/min (ref >60)
GFR calc non Af Amer: >60 mL/min (ref >60)
Potassium: 2.9 mEq/L — ABNORMAL LOW (ref 3.5–5.1)
Potassium: 3.1 mEq/L — ABNORMAL LOW (ref 3.5–5.1)
Total Bilirubin: 1.8 mg/dL — ABNORMAL HIGH (ref 0.3–1.2)
Total Bilirubin: 2.1 mg/dL — ABNORMAL HIGH (ref 0.3–1.2)
Total Protein: 7.6 g/dL (ref 6.0–8.3)
Total Protein: 7.7 g/dL (ref 6.0–8.3)

## 2010-04-11 LAB — H. PYLORI ANTIBODY, IGG: H Pylori IgG: 0.4 {ISR}

## 2010-04-11 LAB — HIV ANTIBODY (ROUTINE TESTING W REFLEX): HIV: NONREACTIVE

## 2010-04-11 LAB — DIFFERENTIAL
Basophils Absolute: 0 10*3/uL (ref 0.0–0.1)
Basophils Absolute: 0 10*3/uL (ref 0.0–0.1)
Basophils Relative: 0 % (ref 0–1)
Eosinophils Relative: 0 % (ref 0–5)
Lymphocytes Relative: 18 % (ref 12–46)
Lymphocytes Relative: 31 % (ref 12–46)
Lymphocytes Relative: 36 % (ref 12–46)
Lymphs Abs: 1.6 10*3/uL (ref 0.7–4.0)
Lymphs Abs: 2.5 10*3/uL (ref 0.7–4.0)
Monocytes Absolute: 0.4 10*3/uL (ref 0.1–1.0)
Monocytes Absolute: 0.6 10*3/uL (ref 0.1–1.0)
Monocytes Absolute: 0.8 10*3/uL (ref 0.1–1.0)
Monocytes Relative: 12 % (ref 3–12)
Neutro Abs: 2.9 10*3/uL (ref 1.7–7.7)
Neutro Abs: 3.7 10*3/uL (ref 1.7–7.7)
Neutro Abs: 4.2 10*3/uL (ref 1.7–7.7)

## 2010-04-11 LAB — BASIC METABOLIC PANEL
BUN: 3 mg/dL — ABNORMAL LOW (ref 6–23)
BUN: 6 mg/dL (ref 6–23)
BUN: 7 mg/dL (ref 6–23)
CO2: 23 mEq/L (ref 19–32)
Chloride: 102 mEq/L (ref 96–112)
Chloride: 103 mEq/L (ref 96–112)
Creatinine, Ser: 0.93 mg/dL (ref 0.4–1.5)
GFR calc non Af Amer: >60 mL/min (ref >60)
GFR calc non Af Amer: >60 mL/min (ref >60)
Glucose, Bld: 102 mg/dL — ABNORMAL HIGH (ref 70–99)
Glucose, Bld: 105 mg/dL — ABNORMAL HIGH (ref 70–99)
Glucose, Bld: 110 mg/dL — ABNORMAL HIGH (ref 70–99)
Potassium: 2.9 mEq/L — ABNORMAL LOW (ref 3.5–5.1)
Potassium: 3.2 mEq/L — ABNORMAL LOW (ref 3.5–5.1)
Potassium: 3.5 mEq/L (ref 3.5–5.1)
Sodium: 133 mEq/L — ABNORMAL LOW (ref 135–145)
Sodium: 134 mEq/L — ABNORMAL LOW (ref 135–145)

## 2010-04-11 LAB — MAGNESIUM: Magnesium: 2.4 mg/dL (ref 1.5–2.5)

## 2010-04-11 LAB — MONONUCLEOSIS SCREEN: Mono Screen: NEGATIVE

## 2010-04-11 LAB — LIPASE, BLOOD: Lipase: 20 U/L (ref 11–59)

## 2010-04-23 ENCOUNTER — Encounter: Payer: Self-pay | Admitting: Family Medicine

## 2010-04-23 ENCOUNTER — Ambulatory Visit (INDEPENDENT_AMBULATORY_CARE_PROVIDER_SITE_OTHER): Payer: BC Managed Care – PPO | Admitting: Family Medicine

## 2010-04-23 VITALS — BP 112/75 | HR 72

## 2010-04-23 DIAGNOSIS — R1115 Cyclical vomiting syndrome unrelated to migraine: Secondary | ICD-10-CM

## 2010-04-23 MED ORDER — PROMETHAZINE HCL 25 MG PO TABS: 25.0000 mg | ORAL_TABLET | Freq: Four times a day (QID) | ORAL | Status: DC | PRN

## 2010-04-23 MED ORDER — NORTRIPTYLINE HCL 25 MG PO CAPS: 25.0000 mg | ORAL_CAPSULE | Freq: Every day | ORAL | Status: DC

## 2010-04-23 NOTE — Patient Instructions (Signed)
It was nice to see you today.  I am adding a medication to help with the vomiting and anxiety.  We are checking labs.

## 2010-04-24 ENCOUNTER — Encounter: Payer: Self-pay | Admitting: Family Medicine

## 2010-04-24 NOTE — Assessment & Plan Note (Signed)
Discussed Dx, Tx, and prevention of episodes. Refilled Phenergan. Patient states that Zofran doesn't work. Rx Nortriptyline to see if this adds to relief. Will send to [facility] if continues to be uncontrolled and limiting function in this patient. Recommended refraining from [facility]. Recheck cortisol and urine VMA today since not acutely ill (was elevated at previous hospitalization, though patient ill at that time).

## 2010-04-24 NOTE — Progress Notes (Signed)
  Subjective:    Patient ID: Samuel Robertson, male    DOB: 09/08/1990, 19 y.o.   MRN: [id number]  HPI  1. Cyclical Vomiting: "Today is a good day." Patient presents for first time when NOT acutely ill. Endorses N/V ~ 2 times per month, each episode lasting several days. He states that he has learned how to control it. No hematemesis. No D/C, melena, BRBPR. He is sleeping better and eating more since starting the Remeron. Endorses occasional THC and admits that it does make him feel worse "at first," but then a little better. He denies sadness. Endorses some anxiety, but thinks that he is handling it okay. Exercising some now. No ETOH, tobacco. He went to Dr. Madilyn Fireman after his last admission for f/u EGD ("it was okay"), but hasn't been back because Dr. Madilyn Fireman could not give him a "reason for the vomiting."  Review of Systems SEE HPI.    Objective:   Physical Exam  Constitutional: He appears well-developed and well-nourished.  Cardiovascular: Normal rate, regular rhythm and normal heart sounds.   Pulmonary/Chest: Breath sounds normal.  Abdominal: Bowel sounds are normal. He exhibits no distension and no mass. There is no tenderness.  Psychiatric:       Flat Affect.      Assessment & Plan:

## 2010-04-29 NOTE — Progress Notes (Signed)
Addended by: Herminio Heads on: 04/29/2010 03:51 PM   Modules accepted: Orders

## 2010-04-29 NOTE — Progress Notes (Addendum)
  Subjective:    Patient ID: Samuel Robertson, male    DOB: 1990/02/20, 20 y.o.   MRN: [id number]  HPI    Review of Systems     Objective:   Physical Exam        Assessment & Plan:  DROPPED OFF 24 HR URINE FOR VMA Leathie Weich

## 2010-05-01 IMAGING — RF DG UGI W/ SMALL BOWEL HIGH DENSITY
14 of 21 series · 14 of 21 positions shown · IV contrast (agent unspecified)
Comparison: CT scan of the abdomen dated 10/11/2008

CLINICAL DATA: Nausea and vomiting

UPPER GI W/ SMALL BOWEL HIGH DENSITY
TECHNIQUE: Upper GI series performed with high density barium and
effervescent agent. Thin barium also used. Subsequently, serial
images of the small bowel were obtained including spot views of the
terminal ileum.
Fluoroscopy Time: 2.5 minutes
Contrast: Double contrast upper GI using thin and thick barium

[Series 1: run · 1 of 1 slices shown (1 of 14)]
[im 1/1]
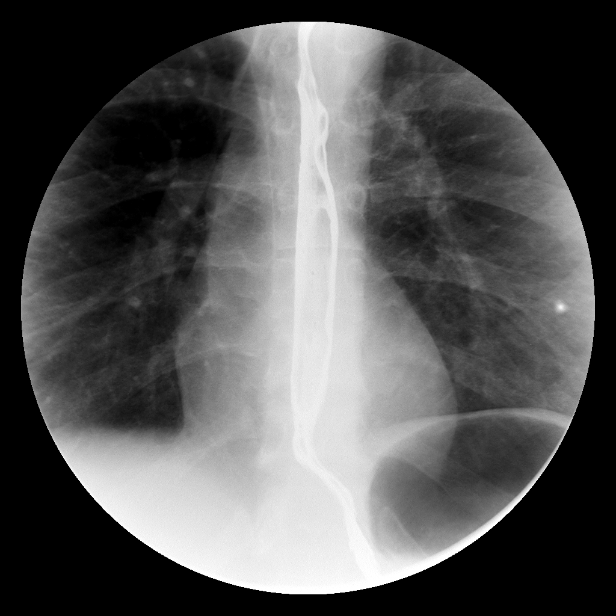

[Series 3: run · 1 of 1 slices shown (2 of 14)]
[im 1/1]
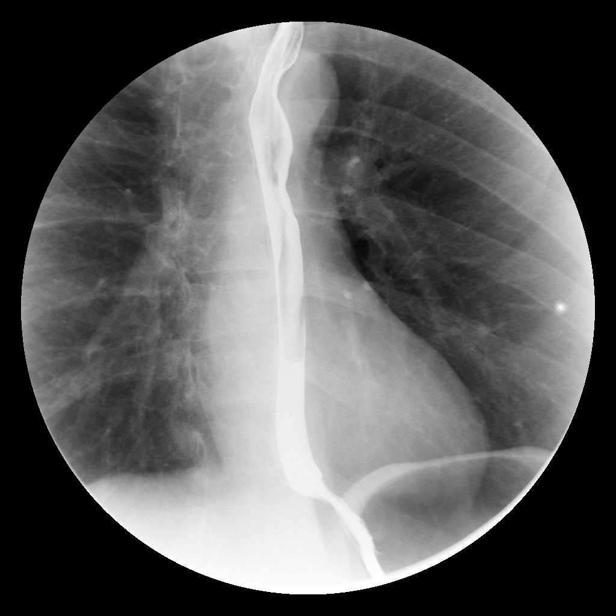

[Series 4: run · 1 of 1 slices shown (3 of 14)]
[im 1/1]
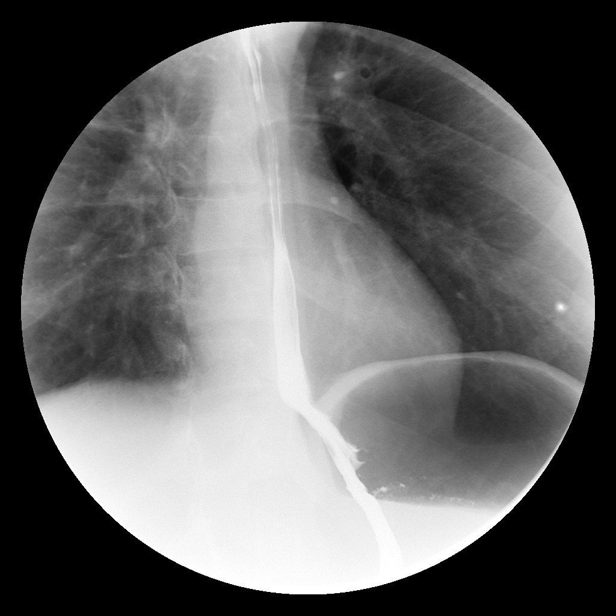

[Series 6: run · 1 of 1 slices shown (4 of 14)]
[im 1/1]
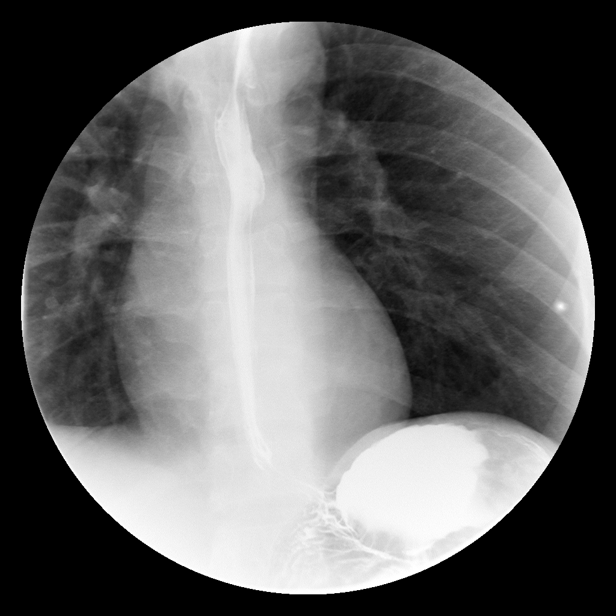

[Series 7: run · 1 of 1 slices shown (5 of 14)]
[im 1/1]
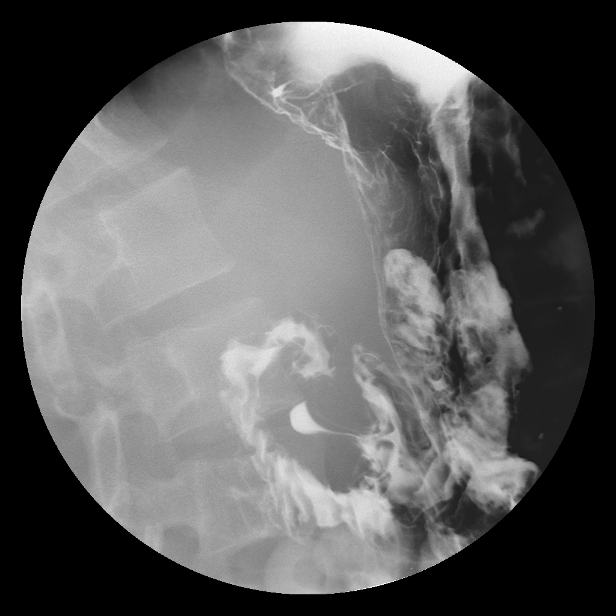

[Series 9: run · 1 of 1 slices shown (6 of 14)]
[im 1/1]
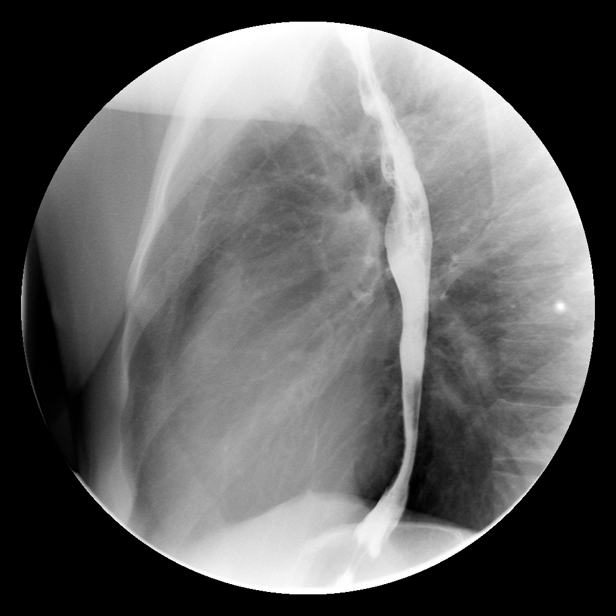

[Series 10: run · 1 of 1 slices shown (7 of 14)]
[im 1/1]
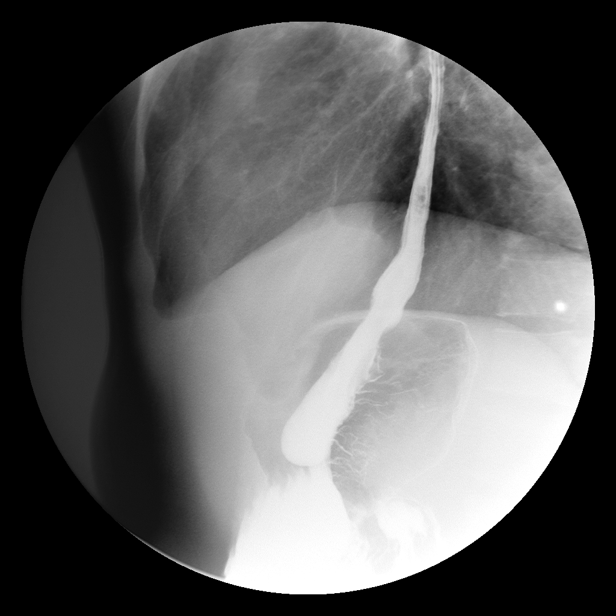

[Series 12: run · 1 of 1 slices shown (8 of 14)]
[im 1/1]
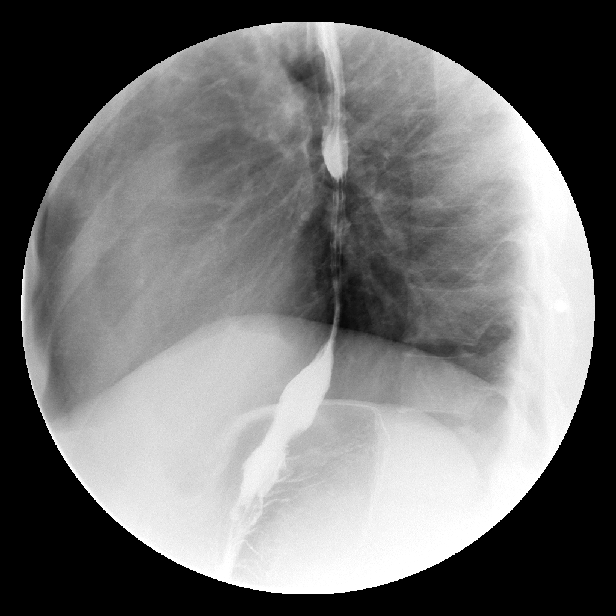

[Series 13: run · 1 of 1 slices shown (9 of 14)]
[im 1/1]
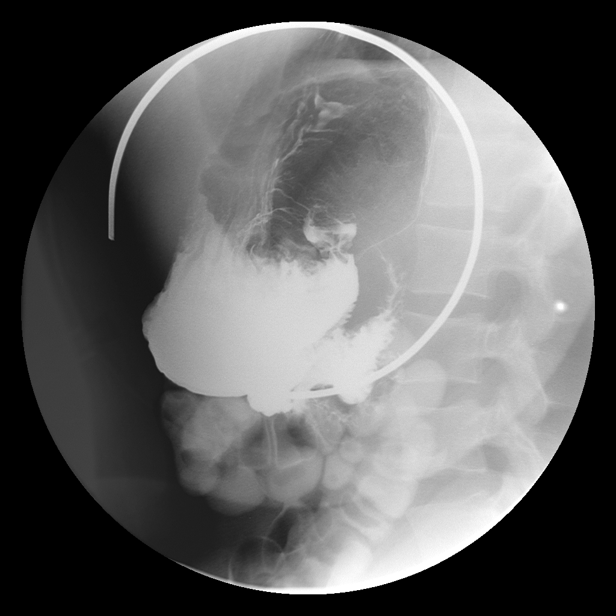

[Series 15: run · 1 of 1 slices shown (10 of 14)]
[im 1/1]
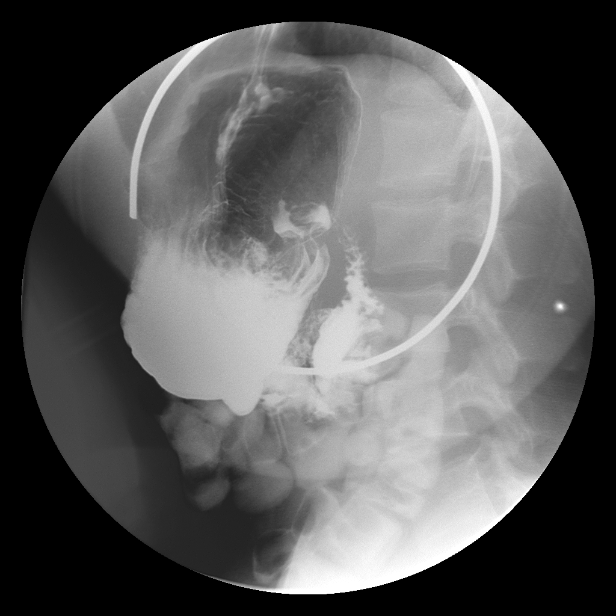

[Series 16: run · 1 of 1 slices shown (11 of 14)]
[im 1/1]
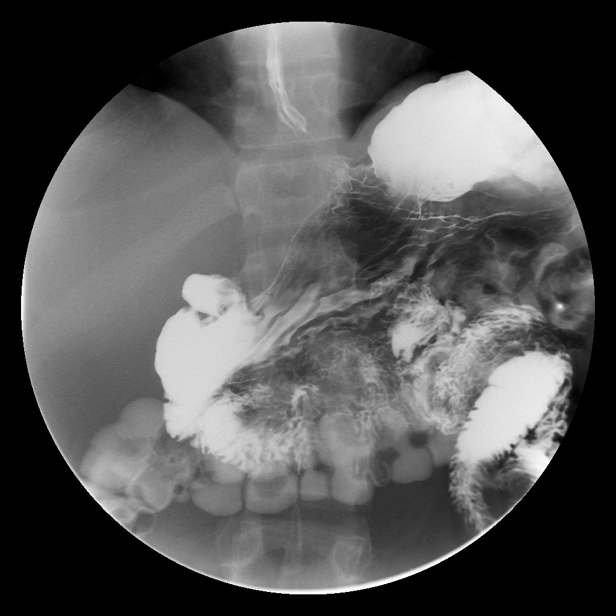

[Series 18: run · 1 of 1 slices shown (12 of 14)]
[im 1/1]
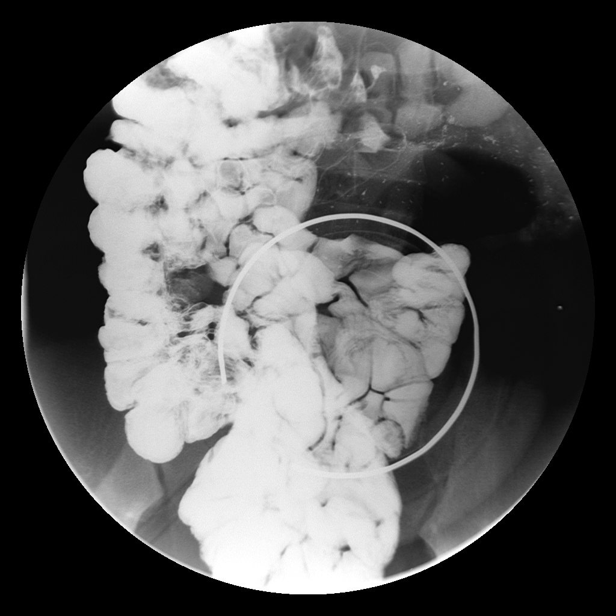

[Series 19: run · 1 of 1 slices shown (13 of 14)]
[im 1/1]
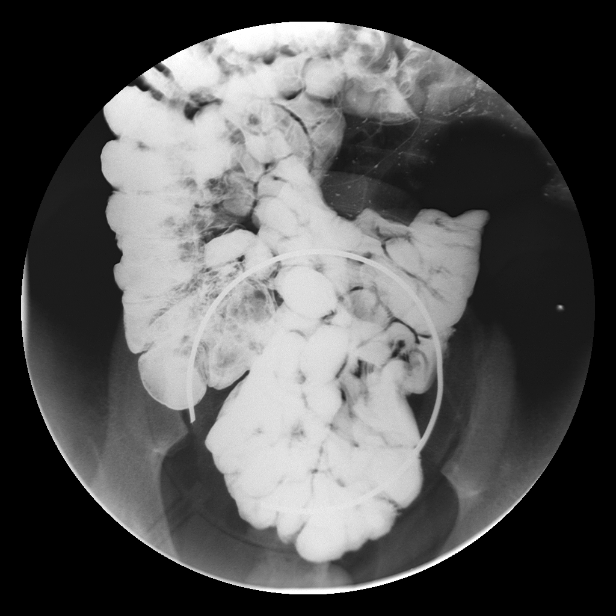

[Series 21: run · 1 of 1 slices shown (14 of 14)]
[im 1/1]
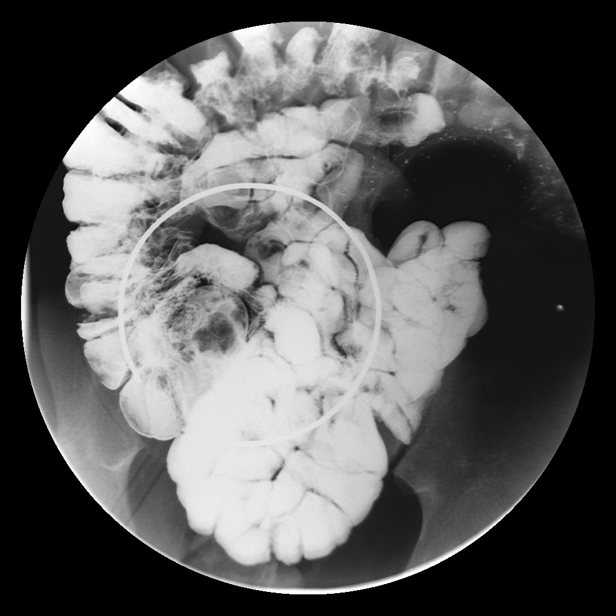

[14 of 21 positions shown; findings below may reference images not displayed]

FINDINGS: The esophagus shows normal contour, distensibility and
peristalsis.  No hiatal hernia or esophageal stricture.  Small
amount of gastroesophageal reflux was noted in distal esophagus.
The stomach shows normal contour, distensibility and peristalsis.
Minimal thickening of the gastric folds may be due to mild
gastritis.  No obstructing or constricting mass.  The duodenal bulb
and duodenal sweep are unremarkable.  There is no evidence of
ulcer.

Spot view and overheads were taken to evaluate the small bowel.
The transit time is within normal limits.  No obstructing or
constricting mass is noted.  The terminal ileum is well visualized
unremarkable.  The proximal colon is unremarkable.
IMPRESSION: 1.  No esophageal stricture or hiatal hernia.  Small amount of
gastroesophageal reflux was noted in distal esophagus.
2.  Minimal thickening of the gastric folds may represent mild
gastritis.
3.  Unremarkable small bowel follow-through examination.  Normal
transit time.

## 2010-05-06 ENCOUNTER — Encounter: Payer: Self-pay | Admitting: Family Medicine

## 2010-06-23 ENCOUNTER — Other Ambulatory Visit: Payer: Self-pay | Admitting: Family Medicine

## 2010-06-24 NOTE — Telephone Encounter (Signed)
Refill request

## 2010-07-01 ENCOUNTER — Telehealth: Payer: Self-pay | Admitting: Family Medicine

## 2010-07-01 NOTE — Telephone Encounter (Signed)
error 

## 2010-09-21 ENCOUNTER — Other Ambulatory Visit: Payer: Self-pay | Admitting: Family Medicine

## 2010-09-22 NOTE — Telephone Encounter (Signed)
Refill request

## 2010-09-25 ENCOUNTER — Other Ambulatory Visit: Payer: Self-pay | Admitting: Family Medicine

## 2010-09-25 NOTE — Telephone Encounter (Signed)
Refill request

## 2010-10-07 ENCOUNTER — Ambulatory Visit: Payer: BC Managed Care – PPO | Admitting: Family Medicine

## 2010-10-18 ENCOUNTER — Ambulatory Visit: Payer: BC Managed Care – PPO | Admitting: Family Medicine

## 2010-10-23 ENCOUNTER — Ambulatory Visit (INDEPENDENT_AMBULATORY_CARE_PROVIDER_SITE_OTHER): Payer: BC Managed Care – PPO | Admitting: Family Medicine

## 2010-10-23 ENCOUNTER — Encounter: Payer: Self-pay | Admitting: Family Medicine

## 2010-10-23 VITALS — BP 140/106 | HR 78 | Ht 69.0 in | Wt 137.0 lb

## 2010-10-23 DIAGNOSIS — I10 Essential (primary) hypertension: Secondary | ICD-10-CM

## 2010-10-23 DIAGNOSIS — F329 Major depressive disorder, single episode, unspecified: Secondary | ICD-10-CM

## 2010-10-23 DIAGNOSIS — R1115 Cyclical vomiting syndrome unrelated to migraine: Secondary | ICD-10-CM

## 2010-10-23 MED ORDER — PANTOPRAZOLE SODIUM 40 MG PO TBEC: 40.0000 mg | DELAYED_RELEASE_TABLET | Freq: Every day | ORAL | Status: DC

## 2010-10-23 MED ORDER — PROPRANOLOL HCL 40 MG PO TABS: 40.0000 mg | ORAL_TABLET | Freq: Two times a day (BID) | ORAL | Status: DC

## 2010-10-23 MED ORDER — MIRTAZAPINE 15 MG PO TABS: 15.0000 mg | ORAL_TABLET | Freq: Every day | ORAL | Status: DC

## 2010-10-23 MED ORDER — NORTRIPTYLINE HCL 25 MG PO CAPS: 25.0000 mg | ORAL_CAPSULE | Freq: Every day | ORAL | Status: DC

## 2010-10-23 MED ORDER — PROMETHAZINE HCL 25 MG PO TABS: 25.0000 mg | ORAL_TABLET | Freq: Four times a day (QID) | ORAL | Status: DC | PRN

## 2010-10-23 NOTE — Progress Notes (Signed)
  Subjective:    Patient ID: Samuel Robertson, male    DOB: 15-Apr-1990, 20 y.o.   MRN: [id number]  HPI Cyclic vomiting: Patient reports one episode per month. Last episode last week lasted 7 days. Nausea and vomiting x10 the first day and usually 5-10 times per day the rest of the days. Then we'll have a period of 3 weeks without nausea vomiting. Food and drink make the nausea vomiting worse. Not eating makes it better. Patient ran out of Phenergan to did not have any to treat self. States the Zofran does not help. Was seen by Dr. Madilyn Fireman GI specialist-who told him that he did not know what was wrong with him. I do not have a copy of the consult note. Patient states he would like a second opinion to see if they can figure out what is going on. No fever. No abdominal pain. No dizziness. No syncope.  Depression: Patient states he is doing okay. Was Taking medications as directed.-Remeron 50 mg daily. That states he ran out one month ago. Patient states he is only depressed when he has to vomiting, otherwise feels fine. No SI. No HI.  Hypertension: Patient states he is taking propranolol as directed except for out currently. Patient needs refill did not take today.  Review of Systems As per above.    Objective:   Physical Exam  Constitutional: He is oriented to person, place, and time. He appears well-developed and well-nourished.  HENT:  Head: Normocephalic and atraumatic.  Cardiovascular: Normal rate, regular rhythm and normal heart sounds.   No murmur heard. Pulmonary/Chest: Effort normal. No respiratory distress. He has no wheezes.  Abdominal: Soft. He exhibits no distension and no mass. There is no tenderness. There is no rebound and no guarding.  Musculoskeletal: He exhibits no edema.  Neurological: He is alert and oriented to person, place, and time.  Skin: Skin is warm and dry. No rash noted.  Psychiatric: He has a normal mood and affect. His behavior is normal.            Assessment & Plan:

## 2010-10-23 NOTE — Patient Instructions (Signed)
I will call [facility] to schedule a GI consult for a second opinion.  The staff will call you with your appointment.  I need you to call or schedule up front a appointment with Lossie Faes- nutritionist- to discuss nutrition concerns.   Return to see me after your appointment with the GI specialist.

## 2010-10-23 NOTE — Assessment & Plan Note (Addendum)
We'll give patient refill of Phenergan, as well as other medications started by prior PCP. Patient to be scheduled for second opinion at [facility]. Will have this arranged. We'll also request consult note from Dr. Madilyn Fireman. Patient also requesting nutrition information regarding healthy weight and healthy diet. Patient would like to gain weight. Patient requested nutrition consult. Patient to arrange and cough for appointment.

## 2010-10-23 NOTE — Assessment & Plan Note (Signed)
Patient taking Remeron. Cause of this x1 month. We'll give refill today. Patient to return for followup after GI consult. We'll discuss in more detail at that time.

## 2010-10-23 NOTE — Assessment & Plan Note (Addendum)
Refill sent to pharmacy. Blood pressure 140/106 today-patient to return for follow up and possibly one month after restarting propanolol. We'll follow closely and adjust medications as needed.

## 2010-11-10 ENCOUNTER — Other Ambulatory Visit: Payer: Self-pay | Admitting: Family Medicine

## 2010-11-10 DIAGNOSIS — R1110 Vomiting, unspecified: Secondary | ICD-10-CM

## 2010-11-14 ENCOUNTER — Telehealth: Payer: Self-pay | Admitting: *Deleted

## 2010-11-14 NOTE — Telephone Encounter (Signed)
Left message for patient to return call. Please tell him he has an appointment with Dr Merri Brunette at [facility] on 01/21/2011 at 1:45 pm. They are located on [address] [place] [address], [address]. If he needs to reschedule please call [phone].Rick Carruthers, Rodena Medin

## 2010-11-18 NOTE — Telephone Encounter (Signed)
fyi .Samuel Robertson  

## 2010-11-18 NOTE — Telephone Encounter (Signed)
Mailed letter with appt information. Pt never called back. See under letters. Lorenda Hatchet, Renato Battles

## 2011-01-31 ENCOUNTER — Encounter: Payer: Self-pay | Admitting: Family Medicine

## 2011-07-08 ENCOUNTER — Ambulatory Visit (INDEPENDENT_AMBULATORY_CARE_PROVIDER_SITE_OTHER): Payer: BC Managed Care – PPO | Admitting: Family Medicine

## 2011-07-08 ENCOUNTER — Encounter: Payer: Self-pay | Admitting: Family Medicine

## 2011-07-08 ENCOUNTER — Other Ambulatory Visit (HOSPITAL_COMMUNITY)
Admission: RE | Admit: 2011-07-08 | Discharge: 2011-07-08 | Disposition: A | Discharge status: Home / Self Care | Payer: BC Managed Care – PPO | Source: Ambulatory Visit | Attending: Family Medicine | Admitting: Family Medicine

## 2011-07-08 VITALS — BP 126/82 | HR 76 | Ht 68.0 in | Wt 153.0 lb

## 2011-07-08 DIAGNOSIS — Z113 Encounter for screening for infections with a predominantly sexual mode of transmission: Secondary | ICD-10-CM

## 2011-07-08 DIAGNOSIS — I10 Essential (primary) hypertension: Secondary | ICD-10-CM

## 2011-07-08 DIAGNOSIS — R1115 Cyclical vomiting syndrome unrelated to migraine: Secondary | ICD-10-CM

## 2011-07-08 DIAGNOSIS — Z2089 Contact with and (suspected) exposure to other communicable diseases: Secondary | ICD-10-CM

## 2011-07-08 DIAGNOSIS — Z202 Contact with and (suspected) exposure to infections with a predominantly sexual mode of transmission: Secondary | ICD-10-CM

## 2011-07-08 DIAGNOSIS — F329 Major depressive disorder, single episode, unspecified: Secondary | ICD-10-CM

## 2011-07-08 MED ORDER — PANTOPRAZOLE SODIUM 40 MG PO TBEC: 40.0000 mg | DELAYED_RELEASE_TABLET | Freq: Every day | ORAL | Status: DC

## 2011-07-08 NOTE — Assessment & Plan Note (Signed)
Patient reports this is largely resolved. His last symptoms were about three weeks ago. He has gained approximately 20 lbs since he was last here in October of 2012. Will refill prescription for pantoprazole and continue to follow.

## 2011-07-08 NOTE — Assessment & Plan Note (Signed)
BP well controlled at 126/82 today. Will continue with current treatment with propranalol.

## 2011-07-08 NOTE — Assessment & Plan Note (Signed)
Stable.  Continue current medications.

## 2011-07-08 NOTE — Progress Notes (Signed)
Subjective:     Patient ID: Samuel Robertson, male   DOB: 1990-03-06, 21 y.o.   MRN: [id number]  HPI 21 y/o man with a history of hypertension, depression, and cyclical vomitting who comes to clinic today for routine STI screening.  He reports one sexual partner in the past three years. He says he uses protection "most of the time," but not always. He states that his partner received STI testing last week, and after a discussion with her he decided he too should seek testing because it h"ad been a few years" since he was last tested.   He reports that his partner's STI testing was "all negative, and that there is no specific STI he is concerned he has contracted.  He denies any past history of STI.   He has no complaints today, specifically denying fever, chills, pain with urination, discharge, and abdominal pain.   He also reports that his cyclical vomiting is much better, and it has been at least three weeks since his last episode.   Review of Systems As per above     Objective:   Physical Exam  HENT:  Right Ear: External ear normal.  Left Ear: External ear normal.  Mouth/Throat: Oropharynx is clear and moist.  Eyes: Conjunctivae are normal. Pupils are equal, round, and reactive to light.  Neck: Neck supple.  Cardiovascular: Normal heart sounds.   Pulmonary/Chest: Breath sounds normal.  Abdominal: Soft.  Psychiatric: He has a normal mood and affect.       Assessment:         Plan:

## 2011-07-08 NOTE — Patient Instructions (Addendum)
We will do STD screenings today.   I will mail you your results.   Return as needed.

## 2011-07-08 NOTE — Assessment & Plan Note (Addendum)
Will check urine culture for chlamydia and gonorrhea. Will order blood tests for HIV antigen and RPR. Patient informed he will be notified by phone if any follow up is required.  Will send results in the mail.

## 2011-07-09 LAB — RPR

## 2011-07-14 ENCOUNTER — Encounter: Payer: Self-pay | Admitting: Family Medicine

## 2011-08-27 ENCOUNTER — Other Ambulatory Visit: Payer: Self-pay | Admitting: Family Medicine

## 2011-11-18 ENCOUNTER — Other Ambulatory Visit: Payer: Self-pay | Admitting: Family Medicine

## 2011-11-18 DIAGNOSIS — R1115 Cyclical vomiting syndrome unrelated to migraine: Secondary | ICD-10-CM

## 2011-11-18 MED ORDER — PANTOPRAZOLE SODIUM 40 MG PO TBEC: 40.0000 mg | DELAYED_RELEASE_TABLET | Freq: Every day | ORAL | Status: DC

## 2012-04-08 ENCOUNTER — Other Ambulatory Visit: Payer: Self-pay | Admitting: *Deleted

## 2012-04-08 MED ORDER — PROPRANOLOL HCL 40 MG PO TABS: ORAL_TABLET | ORAL | Status: DC

## 2012-04-08 MED ORDER — MIRTAZAPINE 15 MG PO TABS: ORAL_TABLET | ORAL | Status: DC

## 2012-04-08 NOTE — Telephone Encounter (Signed)
Patient needs appointment before any more refills 

## 2013-03-04 ENCOUNTER — Encounter: Payer: Self-pay | Admitting: Family Medicine

## 2013-03-04 ENCOUNTER — Ambulatory Visit (INDEPENDENT_AMBULATORY_CARE_PROVIDER_SITE_OTHER): Payer: BC Managed Care – PPO | Admitting: Family Medicine

## 2013-03-04 VITALS — BP 146/102 | HR 73 | Temp 99.3°F | Ht 68.0 in | Wt 142.0 lb

## 2013-03-04 DIAGNOSIS — I10 Essential (primary) hypertension: Secondary | ICD-10-CM

## 2013-03-04 DIAGNOSIS — R1115 Cyclical vomiting syndrome unrelated to migraine: Secondary | ICD-10-CM

## 2013-03-04 MED ORDER — METOCLOPRAMIDE HCL 5 MG PO TABS: 5.0000 mg | ORAL_TABLET | Freq: Three times a day (TID) | ORAL | Status: DC | PRN

## 2013-03-04 MED ORDER — PROMETHAZINE HCL 25 MG/ML IJ SOLN
25.0000 mg | Freq: Once | INTRAMUSCULAR | Status: AC
  Administered 2013-03-04: 25 mg via INTRAMUSCULAR

## 2013-03-04 NOTE — Addendum Note (Signed)
Addended by: Tanna SavoyPROPOSITO, Tiler Brandis S on: 03/04/2013 04:21 PM   Modules accepted: Orders

## 2013-03-04 NOTE — Patient Instructions (Signed)
Mr. Samuel Robertson,   Sorry about the nausea and vomiting. Please fill the prescription for Reglan as needed. This can make you tired, so don't take before work. It can also cause tightening of the muscles of your neck in rare instances. If this happens, take benadryl, which will help the symptoms. If the symptoms do not improve then come back to the doctor.   For your blood pressure, please follow up in 2 weeks.   Sincerely,   Dr. Clinton SawyerWilliamson

## 2013-03-04 NOTE — Progress Notes (Signed)
   Subjective:    Patient ID: Samuel Robertson, male    DOB: Mar 21, 1990, 23 y.o.   MRN: [id number]  HPI  23 year old M with cyclic vomiting syndrome and HTN who presents for nausea and vomiting   Cyclic Vomiting Syndrome - started at age 23, worsened by diet and food, occurs every 3-4 mos; Currently been "sick" since Sunday with nausea, weakness, and vomiting; has difficulty stopping the vomiting; has reduced PO intake but able to drink ginger ale, tea; Pt does not eat after midnights usually but did on Saturday night after going to a movie and think that was what started this episode; he smokes marijuana daily and believes that "depending on the quality," this can help him control his nausea and vomiting; he believes what helps the most are injection of phenergan    PMH - Cyclic vomiting previously seen by Dr. Madilyn FiremanHayes, gastroenterologist, and has not been there is several years; past work up included endoscopy in 2010 that demonstrated candida esophagitis, upper GI series in 2010 that was WNL, CT abdomen in 2010 that was WNL; past medications tried include phenergan, zofran, nortriptyline and remeron which he denies being helpful and currently taking none   Social - + marijuana user   Medications - Only taking propranolol   Review of Systems Positive - subjective fever, chills,  Negative - diarrhea, hematemesis      Objective:   Physical Exam BP 146/102  Pulse 73  Temp(Src) 99.3 F (37.4 C) (Oral)  Ht 5\' 8"  (1.727 m)  Wt 142 lb (64.411 kg)  BMI 21.60 kg/m2  Gen: thin AAM, in mild discomfort but non ill appearing, A&O, conversant HEENT: NCAT, OP clear and moist CV: RRR, no murmurs, CTA-B, 2+ peripheral pulses Abd: soft, NDNT, NABS      Assessment & Plan:

## 2013-03-04 NOTE — Assessment & Plan Note (Signed)
A: pt with exacerbation of CVS likely due to dietary changes and no evidence of infection and certainly no concern for acute abdomen; also pt well hydrated P:  - Phenergan 25 mg IM x 1 in office - Given rx for reglan and explained risks and benefits - Encouraged return to diet restrictions of not eating after midnight

## 2013-03-04 NOTE — Assessment & Plan Note (Signed)
A: mildly elevated BP today, may be due to current discomfort P: f/u in 2 weeks for recheck

## 2013-03-25 ENCOUNTER — Other Ambulatory Visit: Payer: Self-pay | Admitting: Family Medicine

## 2014-03-04 ENCOUNTER — Encounter (HOSPITAL_COMMUNITY): Payer: Self-pay | Admitting: Emergency Medicine

## 2014-03-04 ENCOUNTER — Emergency Department (INDEPENDENT_AMBULATORY_CARE_PROVIDER_SITE_OTHER)
Admission: EM | Admit: 2014-03-04 | Discharge: 2014-03-04 | Disposition: A | Discharge status: Home / Self Care | Payer: BLUE CROSS/BLUE SHIELD | Source: Home / Self Care | Attending: Emergency Medicine | Admitting: Emergency Medicine

## 2014-03-04 DIAGNOSIS — R197 Diarrhea, unspecified: Secondary | ICD-10-CM

## 2014-03-04 DIAGNOSIS — R112 Nausea with vomiting, unspecified: Secondary | ICD-10-CM

## 2014-03-04 DIAGNOSIS — IMO0001 Reserved for inherently not codable concepts without codable children: Secondary | ICD-10-CM

## 2014-03-04 DIAGNOSIS — R030 Elevated blood-pressure reading, without diagnosis of hypertension: Secondary | ICD-10-CM

## 2014-03-04 MED ORDER — PROPRANOLOL HCL 40 MG PO TABS: 40.0000 mg | ORAL_TABLET | Freq: Two times a day (BID) | ORAL | Status: DC

## 2014-03-04 MED ORDER — ONDANSETRON 4 MG PO TBDP
ORAL_TABLET | ORAL | Status: AC
  Filled 2014-03-04: qty 1

## 2014-03-04 MED ORDER — METOCLOPRAMIDE HCL 10 MG PO TABS: 10.0000 mg | ORAL_TABLET | Freq: Three times a day (TID) | ORAL | Status: DC | PRN

## 2014-03-04 MED ORDER — ONDANSETRON 4 MG PO TBDP
4.0000 mg | ORAL_TABLET | Freq: Once | ORAL | Status: AC
  Administered 2014-03-04: 4 mg via ORAL

## 2014-03-04 MED ORDER — METOCLOPRAMIDE HCL 5 MG/ML IJ SOLN: 10.0000 mg | Freq: Once | INTRAMUSCULAR | Status: DC

## 2014-03-04 MED ORDER — METOCLOPRAMIDE HCL 5 MG/ML IJ SOLN
INTRAMUSCULAR | Status: AC
  Filled 2014-03-04: qty 2

## 2014-03-04 MED ORDER — METOCLOPRAMIDE HCL 5 MG/ML IJ SOLN
10.0000 mg | Freq: Once | INTRAMUSCULAR | Status: AC
  Administered 2014-03-04: 10 mg via INTRAMUSCULAR

## 2014-03-04 NOTE — Discharge Instructions (Signed)
Whether you are experiencing reoccurrence of cyclic vomiting or viral gastroenteritis (stomach flu), your exam is not worrisome for acute surgical abdominal process and treatment will be the same. Should stick to clear liquid diet for next 12-24 hours, use reglan as prescribed and seek re-evaluation at local ER if symptoms worsen or if you are unable to keep clear liquids down despite reglan. Should return to taking propranolol for elevated blood pressure and arrange follow up at [facility].  Cyclic Vomiting Syndrome Cyclic vomiting syndrome is a benign condition in which patients experience bouts or cycles of severe nausea and vomiting that last for hours or even days. The bouts of nausea and vomiting alternate with longer periods of no symptoms and generally good health. Cyclic vomiting syndrome occurs mostly in children, but can affect adults. CAUSES  [facility] has no known cause. Each episode is typically similar to the previous ones. The episodes tend to:   Start at about the same time of day.  Last the same length of time.  Present the same symptoms at the same level of intensity. Cyclic vomiting syndrome can begin at any age in children and adults. Cyclic vomiting syndrome usually starts between the ages of 3 and 7 years. In adults, episodes tend to occur less often than they do in children, but they last longer. Furthermore, the events or situations that trigger episodes in adults cannot always be pinpointed as easily as they can in children. There are 4 phases of cyclic vomiting syndrome:  Prodrome. The prodrome phase signals that an episode of nausea and vomiting is about to begin. This phase can last from just a few minutes to several hours. This phase is often marked by belly (abdominal) pain. Sometimes taking medicine early in the prodrome phase can stop an episode in progress. However, sometimes there is no warning. A person may simply wake up in the middle of the night or early morning and begin  vomiting.  Episode. The episode phase consists of:  Severe vomiting.  Nausea.  Gagging (retching).  Recovery. The recovery phase begins when the nausea and vomiting stop. Healthy color, appetite, and energy return.  Symptom-free interval. The symptom-free interval phase is the period between episodes when no symptoms are present. TRIGGERS Episodes can be triggered by an infection or event. Examples of triggers include:  Infections.  Colds, allergies, sinus problems, and the flu.  Eating certain foods such as chocolate or cheese.  Foods with monosodium glutamate (MSG) or preservatives.  Fast foods.  Pre-packaged foods.  Foods with low nutritional value (junk foods).  Overeating.  Eating just before going to bed.  Hot weather.  Dehydration.  Not enough sleep or poor sleep quality.  Physical exhaustion.  Menstruation.  Motion sickness.  Emotional stress (school or home difficulties).  Excitement or stress. SYMPTOMS  The main symptoms of cyclic vomiting syndrome are:  Severe vomiting.  Nausea.  Gagging (retching). Episodes usually begin at night or the first thing in the morning. Episodes may include vomiting or retching up to 5 or 6 times an hour during the worst of the episode. Episodes usually last anywhere from 1 to 4 days. Episodes can last for up to 10 days. Other symptoms include:  Paleness.  Exhaustion.  Listlessness.  Abdominal pain.  Loose stools or diarrhea. Sometimes the nausea and vomiting are so severe that a person appears to be almost unconscious. Sensitivity to light, headache, fever, dizziness, may also accompany an episode. In addition, the vomiting may cause drooling and excessive thirst.  Drinking water usually leads to more vomiting, though the water can dilute the acid in the vomit, making the episode a little less painful. Continuous vomiting can lead to dehydration, which means that the body has lost excessive water and  salts. DIAGNOSIS  Cyclic vomiting syndrome is hard to diagnose because there are no clear tests to identify it. A caregiver must diagnose cyclic vomiting syndrome by looking at symptoms and medical history. A caregiver must exclude more common diseases or disorders that can also cause nausea and vomiting. Also, diagnosis takes time because caregivers need to identify a pattern or cycle to the vomiting. TREATMENT  Cyclic vomiting syndrome cannot be cured. Treatment varies, but people with cyclic vomiting syndrome should get plenty of rest and sleep and take medications that prevent, stop, or lessen the vomiting episodes and other symptoms. People whose episodes are frequent and long-lasting may be treated during the symptom-free intervals in an effort to prevent or ease future episodes. The symptom-free phase is a good time to eliminate anything known to trigger an episode. For example, if episodes are brought on by stress or excitement, this period is the time to find ways to reduce stress and stay calm. If sinus problems or allergies cause episodes, those conditions should be treated. The triggers listed above should be avoided or prevented. Because of the similarities between migraine and cyclic vomiting syndrome, caregivers treat some people with severe cyclic vomiting syndrome with drugs that are also used for migraine headaches. The drugs are designed to:  Prevent episodes.  Reduce their frequency.  Lessen their severity. HOME CARE INSTRUCTIONS Once a vomiting episode begins, treatment is supportive. It helps to stay in bed and sleep in a dark, quiet room. Severe nausea and vomiting may require hospitalization and intravenous (IV) fluids to prevent dehydration. Relaxing medications (sedatives) may help if the nausea continues. Sometimes, during the prodrome phase, it is possible to stop an episode from happening altogether. Only take over-the-counter or prescription medicines for pain, discomfort  or fever as directed by your caregiver. Do not give aspirin to children. During the recovery phase, drinking water and replacing lost electrolytes (salts in the blood) are very important. Electrolytes are salts that the body needs to function well and stay healthy. Symptoms during the recovery phase can vary. Some people find that their appetites return to normal immediately, while others need to begin by drinking clear liquids and then move slowly to solid food. RELATED COMPLICATIONS The severe vomiting that defines cyclic vomiting syndrome is a risk factor for several complications:  Dehydration--Vomiting causes the body to lose water quickly.  Electrolyte imbalance--Vomiting also causes the body to lose the important salts it needs to keep working properly.  Peptic esophagitis--The tube that connects the mouth to the stomach (esophagus) becomes injured from the stomach acid that comes up with the vomit.  Hematemesis--The esophagus becomes irritated and bleeds, so blood mixes with the vomit.  Mallory-Weiss tear--The lower end of the esophagus may tear open or the stomach may bruise from vomiting or retching.  Tooth decay--The acid in the vomit can hurt the teeth by corroding the tooth enamel. SEEK MEDICAL CARE IF: You have questions or problems. Document Released: 03/03/2001 Document Revised: 03/17/2011 Document Reviewed: 04/01/2010 [facility] Patient Information 2015 [place], [place]. This information is not intended to replace advice given to you by your health care provider. Make sure you discuss any questions you have with your health care provider.  Hypertension Hypertension, commonly called high blood pressure, is when the  force of blood pumping through your arteries is too strong. Your arteries are the blood vessels that carry blood from your heart throughout your body. A blood pressure reading consists of a higher number over a lower number, such as 110/72. The higher number (systolic)  is the pressure inside your arteries when your heart pumps. The lower number (diastolic) is the pressure inside your arteries when your heart relaxes. Ideally you want your blood pressure below 120/80. Hypertension forces your heart to work harder to pump blood. Your arteries may become narrow or stiff. Having hypertension puts you at risk for heart disease, stroke, and other problems.  RISK FACTORS Some risk factors for high blood pressure are controllable. Others are not.  Risk factors you cannot control include:   Race. You may be at higher risk if you are African American.  Age. Risk increases with age.  Gender. Men are at higher risk than women before age 1 70years. After age 10065, women are at higher risk than men. Risk factors you can control include:  Not getting enough exercise or physical activity.  Being overweight.  Getting too much fat, sugar, calories, or salt in your diet.  Drinking too much alcohol. SIGNS AND SYMPTOMS Hypertension does not usually cause signs or symptoms. Extremely high blood pressure (hypertensive crisis) may cause headache, anxiety, shortness of breath, and nosebleed. DIAGNOSIS  To check if you have hypertension, your health care provider will measure your blood pressure while you are seated, with your arm held at the level of your heart. It should be measured at least twice using the same arm. Certain conditions can cause a difference in blood pressure between your right and left arms. A blood pressure reading that is higher than normal on one occasion does not mean that you need treatment. If one blood pressure reading is high, ask your health care provider about having it checked again. TREATMENT  Treating high blood pressure includes making lifestyle changes and possibly taking medicine. Living a healthy lifestyle can help lower high blood pressure. You may need to change some of your habits. Lifestyle changes may include:  Following the DASH diet. This  diet is high in fruits, vegetables, and whole grains. It is low in salt, red meat, and added sugars.  Getting at least 2 hours of brisk physical activity every week.  Losing weight if necessary.  Not smoking.  Limiting alcoholic beverages.  Learning ways to reduce stress. If lifestyle changes are not enough to get your blood pressure under control, your health care provider may prescribe medicine. You may need to take more than one. Work closely with your health care provider to understand the risks and benefits. HOME CARE INSTRUCTIONS  Have your blood pressure rechecked as directed by your health care provider.   Take medicines only as directed by your health care provider. Follow the directions carefully. Blood pressure medicines must be taken as prescribed. The medicine does not work as well when you skip doses. Skipping doses also puts you at risk for problems.   Do not smoke.   Monitor your blood pressure at home as directed by your health care provider. SEEK MEDICAL CARE IF:   You think you are having a reaction to medicines taken.  You have recurrent headaches or feel dizzy.  You have swelling in your ankles.  You have trouble with your vision. SEEK IMMEDIATE MEDICAL CARE IF:  You develop a severe headache or confusion.  You have unusual weakness, numbness, or feel faint.  You have severe chest or abdominal pain.  You vomit repeatedly.  You have trouble breathing. MAKE SURE YOU:   Understand these instructions.  Will watch your condition.  Will get help right away if you are not doing well or get worse. Document Released: 12/23/2004 Document Revised: 05/09/2013 Document Reviewed: 10/15/2012 [facility] Patient Information 2015 [place], [place]. This information is not intended to replace advice given to you by your health care provider. Make sure you discuss any questions you have with your health care provider.  Nausea and Vomiting Nausea is a sick  feeling that often comes before throwing up (vomiting). Vomiting is a reflex where stomach contents come out of your mouth. Vomiting can cause severe loss of body fluids (dehydration). Children and elderly adults can become dehydrated quickly, especially if they also have diarrhea. Nausea and vomiting are symptoms of a condition or disease. It is important to find the cause of your symptoms. CAUSES   Direct irritation of the stomach lining. This irritation can result from increased acid production (gastroesophageal reflux disease), infection, food poisoning, taking certain medicines (such as nonsteroidal anti-inflammatory drugs), alcohol use, or tobacco use.  Signals from the brain.These signals could be caused by a headache, heat exposure, an inner ear disturbance, increased pressure in the brain from injury, infection, a tumor, or a concussion, pain, emotional stimulus, or metabolic problems.  An obstruction in the gastrointestinal tract (bowel obstruction).  Illnesses such as diabetes, hepatitis, gallbladder problems, appendicitis, kidney problems, cancer, sepsis, atypical symptoms of a heart attack, or eating disorders.  Medical treatments such as chemotherapy and radiation.  Receiving medicine that makes you sleep (general anesthetic) during surgery. DIAGNOSIS Your caregiver may ask for tests to be done if the problems do not improve after a few days. Tests may also be done if symptoms are severe or if the reason for the nausea and vomiting is not clear. Tests may include:  Urine tests.  Blood tests.  Stool tests.  Cultures (to look for evidence of infection).  X-rays or other imaging studies. Test results can help your caregiver make decisions about treatment or the need for additional tests. TREATMENT You need to stay well hydrated. Drink frequently but in small amounts.You may wish to drink water, sports drinks, clear broth, or eat frozen ice pops or gelatin dessert to help stay  hydrated.When you eat, eating slowly may help prevent nausea.There are also some antinausea medicines that may help prevent nausea. HOME CARE INSTRUCTIONS   Take all medicine as directed by your caregiver.  If you do not have an appetite, do not force yourself to eat. However, you must continue to drink fluids.  If you have an appetite, eat a normal diet unless your caregiver tells you differently.  Eat a variety of complex carbohydrates (rice, wheat, potatoes, bread), lean meats, yogurt, fruits, and vegetables.  Avoid high-fat foods because they are more difficult to digest.  Drink enough water and fluids to keep your urine clear or pale yellow.  If you are dehydrated, ask your caregiver for specific rehydration instructions. Signs of dehydration may include:  Severe thirst.  Dry lips and mouth.  Dizziness.  Dark urine.  Decreasing urine frequency and amount.  Confusion.  Rapid breathing or pulse. SEEK IMMEDIATE MEDICAL CARE IF:   You have blood or brown flecks (like coffee grounds) in your vomit.  You have black or bloody stools.  You have a severe headache or stiff neck.  You are confused.  You have severe abdominal pain.  You have chest pain or trouble breathing.  You do not urinate at least once every 8 hours.  You develop cold or clammy skin.  You continue to vomit for longer than 24 to 48 hours.  You have a fever. MAKE SURE YOU:   Understand these instructions.  Will watch your condition.  Will get help right away if you are not doing well or get worse. Document Released: 12/23/2004 Document Revised: 03/17/2011 Document Reviewed: 05/22/2010 [facility] Patient Information 2015 [place], [place]. This information is not intended to replace advice given to you by your health care provider. Make sure you discuss any questions you have with your health care provider.

## 2014-03-04 NOTE — ED Provider Notes (Signed)
CSN: [id number]     Arrival date & time 03/04/14  1004 History   First MD Initiated Contact with Patient 03/04/14 1057     Chief Complaint  Patient presents with  . Abdominal Pain   (Consider location/radiation/quality/duration/timing/severity/associated sxs/prior Treatment) HPI Comments: 24 year old AA male with cyclic vomiting syndrome and HTN who presents for nausea and vomiting. States N/V began on 03/02/2014 and he reports that he feels that current episode is different that previous episodes of cyclic vomiting as this episode has been associated with diarrhea, malaise and URI sx. States he seem to have excessive post nasal drainage and when this mucous runs down the back of his throat it gags him and makes him nauseous.  Currently denies abdominal pain Cyclic Vomiting Syndrome - started at age 28, worsened by diet and food, occurs every 3-4 mos. Has recently chosen to self discontinue medications previously used to control his symptoms (propranolol, zofran and phenergan).  PMH - Cyclic vomiting previously seen by Dr. Madilyn Fireman, gastroenterologist, and has not been there is several years; past work up included endoscopy in 2011 that demonstrated candida esophagitis, upper GI series in 2011 that was WNL, CT abdomen in 2010 that was WNL; past medications tried include phenergan, zofran, nortriptyline and remeron. No previous abdominal surgery. 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  PCP: [facility]  The history is provided by the patient.    Past Medical History  Diagnosis Date  . Hypertension   . Depression   . Cyclical vomiting   . Substance abuse     THC   Past Surgical History  Procedure Laterality Date  . Esophagogastroduodenoscopy  06/08/09    Esophagitis. Gastritis. Bx Negative. - [facility]  . Eye surgery      Trauma when child.   Family History  Problem Relation Age of Onset  . Hypertension Mother   . Depression Mother   . Sarcoidosis Mother     History  Substance Use Topics  . Smoking status: Former Smoker    Quit date: 01/22/2010  . Smokeless tobacco: Not on file  . Alcohol Use: Not on file    Review of Systems  Constitutional: Positive for appetite change and fatigue.  HENT: Positive for congestion, postnasal drip and rhinorrhea. Negative for sore throat and trouble swallowing.   Respiratory: Negative.   Cardiovascular: Negative.   Gastrointestinal: Positive for nausea, vomiting and diarrhea. Negative for abdominal pain, constipation, blood in stool, abdominal distention, anal bleeding and rectal pain.  Genitourinary: Negative for dysuria, urgency, frequency, hematuria, flank pain, discharge, penile swelling, scrotal swelling, difficulty urinating, genital sores, penile pain and testicular pain.  Musculoskeletal: Negative for back pain and neck pain.  Skin: Negative.   Neurological: Negative for dizziness and light-headedness.    Allergies  Review of patient's allergies indicates no known allergies.  Home Medications   Prior to Admission medications   Medication Sig Start Date End Date Taking? Authorizing Provider  metoCLOPramide (REGLAN) 10 MG tablet Take 1 tablet (10 mg total) by mouth every 8 (eight) hours as needed for nausea. 03/04/14   Mathis Fare Saxton Chain, PA  propranolol (INDERAL) 40 MG tablet Take 1 tablet (40 mg total) by mouth 2 (two) times daily. 03/04/14   Jess Barters H Arryanna Holquin, PA   BP 166/108 mmHg  Pulse 78  Temp(Src) 97.7 F (36.5 C) (Oral)  Resp 20  SpO2 97% Physical Exam  Constitutional: He is oriented to person, place, and time. He appears  well-developed and well-nourished. No distress.  HENT:  Head: Normocephalic and atraumatic.  Mouth/Throat: Oropharynx is clear and moist.  Eyes: Conjunctivae are normal. No scleral icterus.  Neck: Normal range of motion. Neck supple.  Cardiovascular: Normal rate, regular rhythm and normal heart sounds.   Pulmonary/Chest: Effort normal and breath sounds  normal.  Abdominal: Normal appearance and bowel sounds are normal. He exhibits no distension and no mass. There is no tenderness. There is no rigidity, no rebound, no guarding and no CVA tenderness.  Musculoskeletal: Normal range of motion.  Neurological: He is alert and oriented to person, place, and time.  Skin: Skin is warm and dry. No rash noted. No erythema.  Psychiatric: He has a normal mood and affect. His behavior is normal.  Nursing note and vitals reviewed.   ED Course  Procedures (including critical care time) Labs Review Labs Reviewed - No data to display  Imaging Review No results found.   MDM   1. Nausea vomiting and diarrhea   2. Elevated blood pressure    Whether patient is experiencing reoccurrence of cyclic vomiting or viral gastroenteritis, his exam is not worrisome for acute surgical abdominal process and treatment will be the same. Should stick to clear liquid diet for next 12-24 hours, use reglan as prescribed and seek re-evaluation at local ER if symptoms worsen or if he is unable to keep clear liquids down despite reglan. Should return to taking propranolol for elevated blood pressure and arrange follow up at [facility].  Patient received 4mg  dose of ODT zofran and [facility]    Ria ClockJennifer Lee H Sereniti Wan, [place]PA 03/04/14 1154

## 2014-06-10 ENCOUNTER — Emergency Department (HOSPITAL_COMMUNITY)
Admission: EM | Admit: 2014-06-10 | Discharge: 2014-06-10 | Disposition: A | Discharge status: Home / Self Care | Payer: BLUE CROSS/BLUE SHIELD | Source: Home / Self Care | Attending: Family Medicine | Admitting: Family Medicine

## 2014-06-10 ENCOUNTER — Encounter (HOSPITAL_COMMUNITY): Payer: Self-pay | Admitting: Emergency Medicine

## 2014-06-10 DIAGNOSIS — R1115 Cyclical vomiting syndrome unrelated to migraine: Secondary | ICD-10-CM

## 2014-06-10 DIAGNOSIS — G43A1 Cyclical vomiting, intractable: Secondary | ICD-10-CM

## 2014-06-10 MED ORDER — METOCLOPRAMIDE HCL 5 MG/ML IJ SOLN
INTRAMUSCULAR | Status: AC
  Filled 2014-06-10: qty 2

## 2014-06-10 MED ORDER — METOCLOPRAMIDE HCL 5 MG/ML IJ SOLN: 5.0000 mg | Freq: Once | INTRAMUSCULAR | Status: DC

## 2014-06-10 MED ORDER — PROMETHAZINE HCL 25 MG RE SUPP: 25.0000 mg | Freq: Four times a day (QID) | RECTAL | Status: DC | PRN

## 2014-06-10 MED ORDER — ONDANSETRON HCL 4 MG/2ML IJ SOLN
4.0000 mg | Freq: Once | INTRAMUSCULAR | Status: AC
  Administered 2014-06-10: 4 mg via INTRAMUSCULAR

## 2014-06-10 MED ORDER — ONDANSETRON 4 MG PO TBDP
ORAL_TABLET | ORAL | Status: AC
  Filled 2014-06-10: qty 1

## 2014-06-10 NOTE — ED Notes (Signed)
Reports on set of vomiting, diarrhea, and fever on Tuesday lasting the first couple days.  States fever and diarrhea have subsided but still vomiting.  No otc meds taken.

## 2014-06-10 NOTE — Discharge Instructions (Signed)
Thank you for coming in today. If your belly pain worsens, or you have high fever, bad vomiting, blood in your stool or black tarry stool go to the Emergency Room.    Cyclic Vomiting Syndrome Cyclic vomiting syndrome is a benign condition in which patients experience bouts or cycles of severe nausea and vomiting that last for hours or even days. The bouts of nausea and vomiting alternate with longer periods of no symptoms and generally good health. Cyclic vomiting syndrome occurs mostly in children, but can affect adults. CAUSES  [facility] has no known cause. Each episode is typically similar to the previous ones. The episodes tend to:   Start at about the same time of day.  Last the same length of time.  Present the same symptoms at the same level of intensity. Cyclic vomiting syndrome can begin at any age in children and adults. Cyclic vomiting syndrome usually starts between the ages of 3 and 7 years. In adults, episodes tend to occur less often than they do in children, but they last longer. Furthermore, the events or situations that trigger episodes in adults cannot always be pinpointed as easily as they can in children. There are 4 phases of cyclic vomiting syndrome: 1. Prodrome. The prodrome phase signals that an episode of nausea and vomiting is about to begin. This phase can last from just a few minutes to several hours. This phase is often marked by belly (abdominal) pain. Sometimes taking medicine early in the prodrome phase can stop an episode in progress. However, sometimes there is no warning. A person may simply wake up in the middle of the night or early morning and begin vomiting. 2. Episode. The episode phase consists of:  Severe vomiting.  Nausea.  Gagging (retching). 3. Recovery. The recovery phase begins when the nausea and vomiting stop. Healthy color, appetite, and energy return. 4. Symptom-free interval. The symptom-free interval phase is the period between episodes when no  symptoms are present. TRIGGERS Episodes can be triggered by an infection or event. Examples of triggers include:  Infections.  Colds, allergies, sinus problems, and the flu.  Eating certain foods such as chocolate or cheese.  Foods with monosodium glutamate (MSG) or preservatives.  Fast foods.  Pre-packaged foods.  Foods with low nutritional value (junk foods).  Overeating.  Eating just before going to bed.  Hot weather.  Dehydration.  Not enough sleep or poor sleep quality.  Physical exhaustion.  Menstruation.  Motion sickness.  Emotional stress (school or home difficulties).  Excitement or stress. SYMPTOMS  The main symptoms of cyclic vomiting syndrome are:  Severe vomiting.  Nausea.  Gagging (retching). Episodes usually begin at night or the first thing in the morning. Episodes may include vomiting or retching up to 5 or 6 times an hour during the worst of the episode. Episodes usually last anywhere from 1 to 4 days. Episodes can last for up to 10 days. Other symptoms include:  Paleness.  Exhaustion.  Listlessness.  Abdominal pain.  Loose stools or diarrhea. Sometimes the nausea and vomiting are so severe that a person appears to be almost unconscious. Sensitivity to light, headache, fever, dizziness, may also accompany an episode. In addition, the vomiting may cause drooling and excessive thirst. Drinking water usually leads to more vomiting, though the water can dilute the acid in the vomit, making the episode a little less painful. Continuous vomiting can lead to dehydration, which means that the body has lost excessive water and salts. DIAGNOSIS  Cyclic vomiting syndrome  is hard to diagnose because there are no clear tests to identify it. A caregiver must diagnose cyclic vomiting syndrome by looking at symptoms and medical history. A caregiver must exclude more common diseases or disorders that can also cause nausea and vomiting. Also, diagnosis takes  time because caregivers need to identify a pattern or cycle to the vomiting. TREATMENT  Cyclic vomiting syndrome cannot be cured. Treatment varies, but people with cyclic vomiting syndrome should get plenty of rest and sleep and take medications that prevent, stop, or lessen the vomiting episodes and other symptoms. People whose episodes are frequent and long-lasting may be treated during the symptom-free intervals in an effort to prevent or ease future episodes. The symptom-free phase is a good time to eliminate anything known to trigger an episode. For example, if episodes are brought on by stress or excitement, this period is the time to find ways to reduce stress and stay calm. If sinus problems or allergies cause episodes, those conditions should be treated. The triggers listed above should be avoided or prevented. Because of the similarities between migraine and cyclic vomiting syndrome, caregivers treat some people with severe cyclic vomiting syndrome with drugs that are also used for migraine headaches. The drugs are designed to:  Prevent episodes.  Reduce their frequency.  Lessen their severity. HOME CARE INSTRUCTIONS Once a vomiting episode begins, treatment is supportive. It helps to stay in bed and sleep in a dark, quiet room. Severe nausea and vomiting may require hospitalization and intravenous (IV) fluids to prevent dehydration. Relaxing medications (sedatives) may help if the nausea continues. Sometimes, during the prodrome phase, it is possible to stop an episode from happening altogether. Only take over-the-counter or prescription medicines for pain, discomfort or fever as directed by your caregiver. Do not give aspirin to children. During the recovery phase, drinking water and replacing lost electrolytes (salts in the blood) are very important. Electrolytes are salts that the body needs to function well and stay healthy. Symptoms during the recovery phase can vary. Some people find  that their appetites return to normal immediately, while others need to begin by drinking clear liquids and then move slowly to solid food. RELATED COMPLICATIONS The severe vomiting that defines cyclic vomiting syndrome is a risk factor for several complications:  Dehydration--Vomiting causes the body to lose water quickly.  Electrolyte imbalance--Vomiting also causes the body to lose the important salts it needs to keep working properly.  Peptic esophagitis--The tube that connects the mouth to the stomach (esophagus) becomes injured from the stomach acid that comes up with the vomit.  Hematemesis--The esophagus becomes irritated and bleeds, so blood mixes with the vomit.  Mallory-Weiss tear--The lower end of the esophagus may tear open or the stomach may bruise from vomiting or retching.  Tooth decay--The acid in the vomit can hurt the teeth by corroding the tooth enamel. SEEK MEDICAL CARE IF: You have questions or problems. Document Released: 03/03/2001 Document Revised: 03/17/2011 Document Reviewed: 04/01/2010 [facility] Patient Information 2015 [employer], [place]. This information is not intended to replace advice given to you by your health care provider. Make sure you discuss any questions you have with your health care provider.

## 2014-06-10 NOTE — ED Provider Notes (Signed)
Samuel Robertson is a 24 y.o. male who presents to Urgent Care today for vomiting. Patient has had vomiting and diarrhea for the past 5 days or so. He initially had diarrhea but none since last several days. He has not tried any medications. No fevers or chills or abdominal pain. He feels well otherwise. He has a history of cyclic vomiting syndrome.    Past Medical History  Diagnosis Date  . Hypertension   . Depression   . Cyclical vomiting   . Substance abuse     [facility]C   Past Surgical History  Procedure Laterality Date  . Esophagogastroduodenoscopy  06/08/09    Esophagitis. Gastritis. Bx Negative. - [facility]  . Eye surgery      Trauma when child.   History  Substance Use Topics  . Smoking status: Former Smoker    Quit date: 01/22/2010  . Smokeless tobacco: Not on file  . Alcohol Use: Not on file   ROS as above Medications: Current Facility-Administered Medications  Medication Dose Route Frequency Provider Last Rate Last Dose  . metoCLOPramide (REGLAN) injection 5 mg  5 mg Intramuscular Once Rodolph BongEvan S Branae Crail, MD      . ondansetron [facility](ZOFRAN) injection 4 mg  4 mg Intramuscular Once Rodolph BongEvan S Bryton Waight, MD       Current Outpatient Prescriptions  Medication Sig Dispense Refill  . metoCLOPramide (REGLAN) 10 MG tablet Take 1 tablet (10 mg total) by mouth every 8 (eight) hours as needed for nausea. 30 tablet 0  . propranolol (INDERAL) 40 MG tablet Take 1 tablet (40 mg total) by mouth 2 (two) times daily. 60 tablet 0   No Known Allergies   Exam:  BP 158/108 mmHg  Pulse 75  Temp(Src) 99 F (37.2 C) (Oral)  Resp 14  SpO2 100% Gen: Well NAD HEENT: EOMI,  MMM Lungs: Normal work of breathing. CTABL Heart: RRR no MRG Abd: NABS, Soft. Nondistended, Nontender Exts: Brisk capillary refill, warm and well perfused.   Patient was given 4 mg IM Zofran, and 5 mg IM Reglan.  No results found for this or any previous visit (from the past 24 hour(s)). No results found.  Assessment and Plan: 24 y.o.  male with cyclic vomiting. Vital signs normal with the exception of elevated blood pressure. Treat with medications as above. Additionally we'll prescribe Phenergan suppository and follow-up with PCP.  Discussed warning signs or symptoms. Please see discharge instructions. Patient expresses understanding.     Rodolph BongEvan S Caron Tardif, MD 06/10/14 [phone]

## 2014-09-03 ENCOUNTER — Encounter (HOSPITAL_COMMUNITY): Payer: Self-pay | Admitting: Emergency Medicine

## 2014-09-03 ENCOUNTER — Emergency Department (HOSPITAL_COMMUNITY)
Admission: EM | Admit: 2014-09-03 | Discharge: 2014-09-03 | Disposition: A | Discharge status: Home / Self Care | Payer: BLUE CROSS/BLUE SHIELD | Source: Home / Self Care | Attending: Family Medicine | Admitting: Family Medicine

## 2014-09-03 DIAGNOSIS — G43A0 Cyclical vomiting, not intractable: Secondary | ICD-10-CM | POA: Diagnosis not present

## 2014-09-03 DIAGNOSIS — R1115 Cyclical vomiting syndrome unrelated to migraine: Secondary | ICD-10-CM

## 2014-09-03 MED ORDER — PROPRANOLOL HCL 40 MG PO TABS: 40.0000 mg | ORAL_TABLET | Freq: Two times a day (BID) | ORAL | Status: DC

## 2014-09-03 MED ORDER — PROMETHAZINE HCL 25 MG RE SUPP: 25.0000 mg | Freq: Four times a day (QID) | RECTAL | Status: DC | PRN

## 2014-09-03 NOTE — ED Provider Notes (Signed)
CSN: [id number]     Arrival date & time 09/03/14  1318 History   First MD Initiated Contact with Patient 09/03/14 [phone]     Chief Complaint  Patient presents with  . Emesis   (Consider location/radiation/quality/duration/timing/severity/associated sxs/prior Treatment) Patient is a 24 y.o. male presenting with vomiting. The history is provided by the patient. No language interpreter was used.  Emesis Severity:  Mild Duration:  3 days Timing:  Sporadic Able to tolerate:  Liquids and solids Progression:  Improving Associated symptoms: diarrhea   Associated symptoms: no abdominal pain, no arthralgias, no chills, no cough, no fever and no URI   Risk factors: no sick contacts   Patient with diagnosis of cyclic vomiting syndrome and prior evaluation by GI by his report, presents with nausea/vomiting that had onset Friday August 26th.  Had several bouts of emesis and was unable to tolerate by mouth. During day on Sat Aug 27th began to tolerate soups and wheat bread. Triggers for his Cyclic Vomiting have been poor sleep, dairy, and eating late at night.  He denies any of these triggers recently.  Had some alcohol in a social setting on Thurs Aug 25th.  Denies tobacco use, does use THC on occasion and has noticed it seems to help his cyclic vomiting symptoms.  Had refrained for several months and had worse symptoms, then returned to use and had improvement.   ROS: No fevers or chills, has had some mild diarrhea (3 bouts on first day of this episode but then resolved).  No blood in stool.  No cough, no shortness of breath, no abdominal pain. No abdominla surgical history.   Past Medical History  Diagnosis Date  . Hypertension   . Depression   . Cyclical vomiting   . Substance abuse     [facility]   Past Surgical History  Procedure Laterality Date  . Esophagogastroduodenoscopy  06/08/09    Esophagitis. Gastritis. Bx Negative. - [facility]  . Eye surgery      Trauma when child.   Family History  Problem  Relation Age of Onset  . Hypertension Mother   . Depression Mother   . Sarcoidosis Mother    Social History  Substance Use Topics  . Smoking status: Former Smoker    Quit date: 01/22/2010  . Smokeless tobacco: None  . Alcohol Use: None    Review of Systems  Constitutional: Negative for chills.  Gastrointestinal: Positive for vomiting and diarrhea. Negative for abdominal pain.  Musculoskeletal: Negative for arthralgias.    Allergies  Review of patient's allergies indicates no known allergies.  Home Medications   Prior to Admission medications   Medication Sig Start Date End Date Taking? Authorizing Provider  metoCLOPramide (REGLAN) 10 MG tablet Take 1 tablet (10 mg total) by mouth every 8 (eight) hours as needed for nausea. 03/04/14   Ria Clock, PA  promethazine (PHENERGAN) 25 MG suppository Place 1 suppository (25 mg total) rectally every 6 (six) hours as needed for nausea or vomiting. 09/03/14   Barbaraann Barthel, MD  propranolol (INDERAL) 40 MG tablet Take 1 tablet (40 mg total) by mouth 2 (two) times daily. 09/03/14   Barbaraann Barthel, MD   Meds Ordered and Administered this Visit  Medications - No data to display  BP 129/99 mmHg  Pulse 59  Temp(Src) 98.2 F (36.8 C) (Oral)  Resp 16  SpO2 100% No data found.   Physical Exam  Constitutional: He appears well-developed and well-nourished. No distress.  HENT:  Head: Normocephalic and atraumatic.  Right Ear: External ear normal.  Left Ear: External ear normal.  Nose: Nose normal.  Mouth/Throat: Oropharynx is clear and moist. No oropharyngeal exudate.  Eyes: Conjunctivae are normal. Pupils are equal, round, and reactive to light. Right eye exhibits no discharge. Left eye exhibits no discharge. No scleral icterus.  Neck: Neck supple. No thyromegaly present.  Cardiovascular: Normal rate, regular rhythm, normal heart sounds and intact distal pulses.  Exam reveals no gallop and no friction rub.   No murmur  heard. Pulmonary/Chest: Effort normal and breath sounds normal. No respiratory distress. He has no wheezes. He has no rales. He exhibits no tenderness.  Abdominal: Soft. Bowel sounds are normal. He exhibits no distension and no mass. There is no tenderness. There is no rebound and no guarding.  Lymphadenopathy:    He has no cervical adenopathy.  Skin: He is not diaphoretic.    ED Course  Procedures (including critical care time)  Labs Review Labs Reviewed - No data to display  Imaging Review No results found.   Visual Acuity Review  Right Eye Distance:   Left Eye Distance:   Bilateral Distance:    Right Eye Near:   Left Eye Near:    Bilateral Near:         MDM   1. Non-intractable cyclical vomiting with nausea        Barbaraann Barthel, MD 09/03/14 1630

## 2014-09-03 NOTE — Discharge Instructions (Signed)
It was a pleasure to see you today.   For the cyclic vomiting syndrome, continue to sip on clear liquids as you have been doing.   Prescription for Phenergan suppository, to use if worsening nausea/vomiting.   Refill of propranolol until can be addressed by your primary doctor at [facility]   Call or return if worsening vomiting, to the point that you cannot keep down fluids, or if worsening abdominal pain.   Note for work done today.  Cyclic Vomiting Syndrome Cyclic vomiting syndrome is a benign condition in which patients experience bouts or cycles of severe nausea and vomiting that last for hours or even days. The bouts of nausea and vomiting alternate with longer periods of no symptoms and generally good health. Cyclic vomiting syndrome occurs mostly in children, but can affect adults. CAUSES  [facility] has no known cause. Each episode is typically similar to the previous ones. The episodes tend to:   Start at about the same time of day.  Last the same length of time.  Present the same symptoms at the same level of intensity. Cyclic vomiting syndrome can begin at any age in children and adults. Cyclic vomiting syndrome usually starts between the ages of 3 and 7 years. In adults, episodes tend to occur less often than they do in children, but they last longer. Furthermore, the events or situations that trigger episodes in adults cannot always be pinpointed as easily as they can in children. There are 4 phases of cyclic vomiting syndrome: 1. Prodrome. The prodrome phase signals that an episode of nausea and vomiting is about to begin. This phase can last from just a few minutes to several hours. This phase is often marked by belly (abdominal) pain. Sometimes taking medicine early in the prodrome phase can stop an episode in progress. However, sometimes there is no warning. A person may simply wake up in the middle of the night or early morning and begin vomiting. 2. Episode. The  episode phase consists of:  Severe vomiting.  Nausea.  Gagging (retching). 3. Recovery. The recovery phase begins when the nausea and vomiting stop. Healthy color, appetite, and energy return. 4. Symptom-free interval. The symptom-free interval phase is the period between episodes when no symptoms are present. TRIGGERS Episodes can be triggered by an infection or event. Examples of triggers include:  Infections.  Colds, allergies, sinus problems, and the flu.  Eating certain foods such as chocolate or cheese.  Foods with monosodium glutamate (MSG) or preservatives.  Fast foods.  Pre-packaged foods.  Foods with low nutritional value (junk foods).  Overeating.  Eating just before going to bed.  Hot weather.  Dehydration.  Not enough sleep or poor sleep quality.  Physical exhaustion.  Menstruation.  Motion sickness.  Emotional stress (school or home difficulties).  Excitement or stress. SYMPTOMS  The main symptoms of cyclic vomiting syndrome are:  Severe vomiting.  Nausea.  Gagging (retching). Episodes usually begin at night or the first thing in the morning. Episodes may include vomiting or retching up to 5 or 6 times an hour during the worst of the episode. Episodes usually last anywhere from 1 to 4 days. Episodes can last for up to 10 days. Other symptoms include:  Paleness.  Exhaustion.  Listlessness.  Abdominal pain.  Loose stools or diarrhea. Sometimes the nausea and vomiting are so severe that a person appears to be almost unconscious. Sensitivity to light, headache, fever, dizziness, may also accompany an episode. In addition, the vomiting may cause  drooling and excessive thirst. Drinking water usually leads to more vomiting, though the water can dilute the acid in the vomit, making the episode a little less painful. Continuous vomiting can lead to dehydration, which means that the body has lost excessive water and salts. DIAGNOSIS  Cyclic  vomiting syndrome is hard to diagnose because there are no clear tests to identify it. A caregiver must diagnose cyclic vomiting syndrome by looking at symptoms and medical history. A caregiver must exclude more common diseases or disorders that can also cause nausea and vomiting. Also, diagnosis takes time because caregivers need to identify a pattern or cycle to the vomiting. TREATMENT  Cyclic vomiting syndrome cannot be cured. Treatment varies, but people with cyclic vomiting syndrome should get plenty of rest and sleep and take medications that prevent, stop, or lessen the vomiting episodes and other symptoms. People whose episodes are frequent and long-lasting may be treated during the symptom-free intervals in an effort to prevent or ease future episodes. The symptom-free phase is a good time to eliminate anything known to trigger an episode. For example, if episodes are brought on by stress or excitement, this period is the time to find ways to reduce stress and stay calm. If sinus problems or allergies cause episodes, those conditions should be treated. The triggers listed above should be avoided or prevented. Because of the similarities between migraine and cyclic vomiting syndrome, caregivers treat some people with severe cyclic vomiting syndrome with drugs that are also used for migraine headaches. The drugs are designed to:  Prevent episodes.  Reduce their frequency.  Lessen their severity. HOME CARE INSTRUCTIONS Once a vomiting episode begins, treatment is supportive. It helps to stay in bed and sleep in a dark, quiet room. Severe nausea and vomiting may require hospitalization and intravenous (IV) fluids to prevent dehydration. Relaxing medications (sedatives) may help if the nausea continues. Sometimes, during the prodrome phase, it is possible to stop an episode from happening altogether. Only take over-the-counter or prescription medicines for pain, discomfort or fever as directed by  your caregiver. Do not give aspirin to children. During the recovery phase, drinking water and replacing lost electrolytes (salts in the blood) are very important. Electrolytes are salts that the body needs to function well and stay healthy. Symptoms during the recovery phase can vary. Some people find that their appetites return to normal immediately, while others need to begin by drinking clear liquids and then move slowly to solid food. RELATED COMPLICATIONS The severe vomiting that defines cyclic vomiting syndrome is a risk factor for several complications:  Dehydration--Vomiting causes the body to lose water quickly.  Electrolyte imbalance--Vomiting also causes the body to lose the important salts it needs to keep working properly.  Peptic esophagitis--The tube that connects the mouth to the stomach (esophagus) becomes injured from the stomach acid that comes up with the vomit.  Hematemesis--The esophagus becomes irritated and bleeds, so blood mixes with the vomit.  Mallory-Weiss tear--The lower end of the esophagus may tear open or the stomach may bruise from vomiting or retching.  Tooth decay--The acid in the vomit can hurt the teeth by corroding the tooth enamel. SEEK MEDICAL CARE IF: You have questions or problems. Document Released: 03/03/2001 Document Revised: 03/17/2011 Document Reviewed: 04/01/2010 [facility] Patient Information 2015 [place], [place]. This information is not intended to replace advice given to you by your health care provider. Make sure you discuss any questions you have with your health care provider.

## 2014-09-03 NOTE — ED Notes (Signed)
The patient stated that he has been vomiting x 3 days. The patient stated that he was vomiting when he eats or drinks. The patient stated that he has been trying the [employer] with not much relief. He stated that he is also lethargic. The patient also requested a refill on his propanolol.

## 2014-11-20 ENCOUNTER — Emergency Department (HOSPITAL_COMMUNITY)
Admission: EM | Admit: 2014-11-20 | Discharge: 2014-11-20 | Disposition: A | Discharge status: Home / Self Care | Payer: BLUE CROSS/BLUE SHIELD | Source: Home / Self Care | Attending: Family Medicine | Admitting: Family Medicine

## 2014-11-20 ENCOUNTER — Encounter (HOSPITAL_COMMUNITY): Payer: Self-pay | Admitting: Emergency Medicine

## 2014-11-20 DIAGNOSIS — G43A0 Cyclical vomiting, not intractable: Secondary | ICD-10-CM

## 2014-11-20 DIAGNOSIS — R1115 Cyclical vomiting syndrome unrelated to migraine: Secondary | ICD-10-CM

## 2014-11-20 NOTE — ED Provider Notes (Signed)
CSN: [id number]     Arrival date & time 11/20/14  1306 History   First MD Initiated Contact with Patient 11/20/14 1420     Chief Complaint  Patient presents with  . Emesis   (Consider location/radiation/quality/duration/timing/severity/associated sxs/prior Treatment) HPI Comments: 24 year old male with a history of cyclic vomiting  syndrome since the age of 35 has continued to have symptoms intermittently for several years. His last episode occurred one week ago. He has been out of work with initially diarrhea that spontaneously resolved associated with episodes of vomiting and nausea. He states his last episode of nausea and vomiting was yesterday. He is now able to hold liquids and light foods down now. No vomiting today.  This diagnosis has been documented in his chart several times. He is currently utilizing promethazine and propranolol for both CVS and blood pressure. He is requesting a note to go back to work today.    Past Medical History  Diagnosis Date  . Hypertension   . Depression   . Cyclical vomiting   . Substance abuse     THC   Past Surgical History  Procedure Laterality Date  . Esophagogastroduodenoscopy  06/08/09    Esophagitis. Gastritis. Bx Negative. - [facility]  . Eye surgery      Trauma when child.   Family History  Problem Relation Age of Onset  . Hypertension Mother   . Depression Mother   . Sarcoidosis Mother    Social History  Substance Use Topics  . Smoking status: Former Smoker    Quit date: 01/22/2010  . Smokeless tobacco: None  . Alcohol Use: None    Review of Systems  Constitutional: Positive for activity change and appetite change. Negative for fever.  HENT: Negative.   Respiratory: Negative.   Cardiovascular: Negative.   Gastrointestinal:       Today he has no GI symptoms.  Genitourinary: Negative.   Musculoskeletal: Negative.   Skin: Negative.   Neurological: Negative.     Allergies  Review of patient's allergies indicates no known  allergies.  Home Medications   Prior to Admission medications   Medication Sig Start Date End Date Taking? Authorizing Provider  metoCLOPramide (REGLAN) 10 MG tablet Take 1 tablet (10 mg total) by mouth every 8 (eight) hours as needed for nausea. 03/04/14   Ria Clock, PA  promethazine (PHENERGAN) 25 MG suppository Place 1 suppository (25 mg total) rectally every 6 (six) hours as needed for nausea or vomiting. 09/03/14   Barbaraann Barthel, MD  propranolol (INDERAL) 40 MG tablet Take 1 tablet (40 mg total) by mouth 2 (two) times daily. 09/03/14   Barbaraann Barthel, MD   Meds Ordered and Administered this Visit  Medications - No data to display  BP 124/87 mmHg  Pulse 76  Temp(Src) 98.9 F (37.2 C) (Oral)  Resp 16  SpO2 99% No data found.   Physical Exam  Constitutional: He is oriented to person, place, and time. He appears well-developed and well-nourished. No distress.  Eyes: EOM are normal.  Neck: Normal range of motion. Neck supple.  Cardiovascular: Normal rate, regular rhythm and normal heart sounds.   Pulmonary/Chest: Effort normal. No respiratory distress. He has no wheezes. He has no rales.  Abdominal: Soft. He exhibits no distension. There is no tenderness. There is no rebound and no guarding.  Musculoskeletal: He exhibits no edema.  Neurological: He is alert and oriented to person, place, and time. He exhibits normal muscle tone.  Skin: Skin is warm  and dry.  Psychiatric: He has a normal mood and affect.  Nursing note and vitals reviewed.   ED Course  Procedures (including critical care time)  Labs Review Labs Reviewed - No data to display  Imaging Review No results found.   Visual Acuity Review  Right Eye Distance:   Left Eye Distance:   Bilateral Distance:    Right Eye Near:   Left Eye Near:    Bilateral Near:         MDM   1. Non-intractable cyclical vomiting with nausea    Patient with history of cyclical vomiting syndrome that started  possibly one week ago has now completed the cycle and is asymptomatic today. He is not tachycardic. He is able to drink and hold liquids down. He has been out of work and is requesting a note to go back to work. He is afebrile, no signs of infection and his abdominal exam is benign. He may return to work and follow-up with his PCP. No medications prescribed this visit.  And  Hayden Rasmussenavid Benicia Bergevin, NP 11/20/14 [phone]

## 2014-11-20 NOTE — ED Notes (Signed)
Patient complains of "cyclic vomiting syndrome" patient reports he has come here to [facility] the last 3 episodes he has had.  Patient's pcp is [facility].  Patient reports vomiting started 11/8, patient reports vomiting stopped yesterday, 11/14.  Patient had a bm yesterday, and prior to yesterday the last bm was 11/8 when vomiting started.

## 2014-12-18 ENCOUNTER — Ambulatory Visit (INDEPENDENT_AMBULATORY_CARE_PROVIDER_SITE_OTHER): Payer: BLUE CROSS/BLUE SHIELD | Admitting: Family Medicine

## 2014-12-18 VITALS — BP 162/113 | HR 110 | Temp 98.9°F | Ht 68.0 in | Wt 141.8 lb

## 2014-12-18 DIAGNOSIS — G43A1 Cyclical vomiting, intractable: Secondary | ICD-10-CM | POA: Diagnosis not present

## 2014-12-18 DIAGNOSIS — Z0283 Encounter for blood-alcohol and blood-drug test: Secondary | ICD-10-CM

## 2014-12-18 DIAGNOSIS — R1115 Cyclical vomiting syndrome unrelated to migraine: Secondary | ICD-10-CM

## 2014-12-18 DIAGNOSIS — I10 Essential (primary) hypertension: Secondary | ICD-10-CM

## 2014-12-18 DIAGNOSIS — G43A0 Cyclical vomiting, not intractable: Secondary | ICD-10-CM

## 2014-12-18 DIAGNOSIS — Z0189 Encounter for other specified special examinations: Secondary | ICD-10-CM

## 2014-12-18 LAB — POCT URINALYSIS DIPSTICK
Glucose, UA: NEGATIVE
Leukocytes, UA: NEGATIVE
Nitrite, UA: POSITIVE
Protein, UA: >=300
Spec Grav, UA: >=1.03
UROBILINOGEN UA: 4
pH, UA: 6.5

## 2014-12-18 MED ORDER — METOCLOPRAMIDE HCL 10 MG PO TABS: 10.0000 mg | ORAL_TABLET | Freq: Three times a day (TID) | ORAL | Status: DC | PRN

## 2014-12-18 MED ORDER — PROPRANOLOL HCL 40 MG PO TABS: 40.0000 mg | ORAL_TABLET | Freq: Two times a day (BID) | ORAL | Status: DC

## 2014-12-18 MED ORDER — PROMETHAZINE HCL 25 MG RE SUPP: 25.0000 mg | Freq: Four times a day (QID) | RECTAL | Status: DC | PRN

## 2014-12-18 MED ORDER — ONDANSETRON HCL 4 MG/2ML IJ SOLN
8.0000 mg | Freq: Once | INTRAMUSCULAR | Status: AC
  Administered 2014-12-18: 8 mg via INTRAMUSCULAR

## 2014-12-18 NOTE — Progress Notes (Signed)
   HPI  VOMITING Dx w/ cyclic vomiting last year. Has been dealing w/ these symptoms for the past 5-6 years. Another episode seems to have started this past Thursday. Doesn't really know what his triggers are. Vomiting every 20-30 minutes for the first 24-36hrs. Progressively improved. Vomited once yesterday.  Last episode: 4-6 weeks ago. Which is typical between his recurrences.   Vomiting began 5 days ago. Progression: improving Medications tried: Phenergan (suppository) Recent sick contacts: no Ingested suspicious foods: no Immunocompromised: no  Symptoms Diarrhea: had some on Day 1 but not since Abdominal pain: minimal Blood in vomit: no Weight loss: no Decreased urine output: no Lightheadedness: no Fever: no Bloody stools: no  HTN: patient has been out of medication for >1 month. States that he usually feels a little better when on the medication but it doesn't prevent the episodes of vomiting.  ROS: no dysuria, hematuria, or flank pain. Otherwise see HPI Smoking Status noted  Objective: BP 162/113 mmHg  Pulse 110  Temp(Src) 98.9 F (37.2 C) (Oral)  Ht 5\' 8"  (1.727 m)  Wt 141 lb 12.8 oz (64.32 kg)  BMI 21.57 kg/m2 Gen: NAD, alert, cooperative, appeared uncomfortable. HEENT: NCAT, EOMI, PERRL Abd: SNTND, BS present, no guarding or organomegaly Ext: No edema, warm Neuro: Alert and oriented, Speech clear, No gross deficits  Assessment and plan:  Cyclical vomiting Patient here w/ episodes of cyclical vomiting. Episodes usually occur every 4-6 weeks and last a period of 3-4 days. Phenergan suppository helps. Patient believes episodes are more tolerable recently.  - I believe there is a bit of a psych aspect to these; patient reported 1 episode of diarrhea that seemed to signal to him, he was going to have another episode. Also, some improvement after he broke up w/ his previous girlfriend (went 3-4 months w/o episode). I will explore this in the future. - refilled  meds  Essential hypertension, benign HR/BP very much elevated. Has not been on BB due to no refills. Meds refilled today. I would like to have him f/u for this. Will adjust/add medications as necessary.    Orders Placed This Encounter  Procedures  . Urine culture  . COMPLETE METABOLIC PANEL WITH GFR  . POCT urinalysis dipstick    Meds ordered this encounter  Medications  . metoCLOPramide (REGLAN) 10 MG tablet    Sig: Take 1 tablet (10 mg total) by mouth every 8 (eight) hours as needed for nausea.    Dispense:  30 tablet    Refill:  5  . promethazine (PHENERGAN) 25 MG suppository    Sig: Place 1 suppository (25 mg total) rectally every 6 (six) hours as needed for nausea or vomiting.    Dispense:  12 each    Refill:  3  . propranolol (INDERAL) 40 MG tablet    Sig: Take 1 tablet (40 mg total) by mouth 2 (two) times daily.    Dispense:  60 tablet    Refill:  5  . ondansetron (ZOFRAN) injection 8 mg    Sig:      Kathee DeltonIan D McKeag, MD,MS,  PGY2 12/19/2014 12:10 PM

## 2014-12-18 NOTE — Patient Instructions (Signed)
It was a pleasure seeing you today in our clinic. Today we discussed your cyclic vomiting syndrome. Here is the treatment plan we have discussed and agreed upon together:   - Today we provided you with an injection: 8 mg Zofran. - I've refilled your home medications. - I would encourage you to attempt a vacation away from marijuana use. - I would like to see her back for a regular checkup in 6 months.

## 2014-12-19 LAB — COMPLETE METABOLIC PANEL WITH GFR
ALT: 8 U/L — ABNORMAL LOW (ref 9–46)
AST: 15 U/L (ref 10–40)
Albumin: 5.3 g/dL — ABNORMAL HIGH (ref 3.6–5.1)
Alkaline Phosphatase: 56 U/L (ref 40–115)
BUN: 11 mg/dL (ref 7–25)
CO2: 28 mmol/L (ref 20–31)
Calcium: 10.6 mg/dL — ABNORMAL HIGH (ref 8.6–10.3)
Chloride: 92 mmol/L — ABNORMAL LOW (ref 98–110)
Creat: 0.96 mg/dL (ref 0.60–1.35)
GFR, Est African American: >89 mL/min (ref >=60)
GFR, Est Non African American: >89 mL/min (ref >=60)
Glucose, Bld: 119 mg/dL — ABNORMAL HIGH (ref 65–99)
Potassium: 3.7 mmol/L (ref 3.5–5.3)
Sodium: 137 mmol/L (ref 135–146)
Total Bilirubin: 1.9 mg/dL — ABNORMAL HIGH (ref 0.2–1.2)
Total Protein: 8.8 g/dL — ABNORMAL HIGH (ref 6.1–8.1)

## 2014-12-19 NOTE — Assessment & Plan Note (Signed)
HR/BP very much elevated. Has not been on BB due to no refills. Meds refilled today. I would like to have him f/u for this. Will adjust/add medications as necessary.

## 2014-12-19 NOTE — Assessment & Plan Note (Signed)
Patient here w/ episodes of cyclical vomiting. Episodes usually occur every 4-6 weeks and last a period of 3-4 days. Phenergan suppository helps. Patient believes episodes are more tolerable recently.  - I believe there is a bit of a psych aspect to these; patient reported 1 episode of diarrhea that seemed to signal to him, he was going to have another episode. Also, some improvement after he broke up w/ his previous girlfriend (went 3-4 months w/o episode). I will explore this in the future. - refilled meds

## 2014-12-20 LAB — URINE CULTURE
Colony Count: NO GROWTH
Organism ID, Bacteria: NO GROWTH

## 2015-09-29 ENCOUNTER — Ambulatory Visit (HOSPITAL_COMMUNITY)
Admission: EM | Admit: 2015-09-29 | Discharge: 2015-09-29 | Discharge status: Absent without leave | Payer: BLUE CROSS/BLUE SHIELD

## 2015-09-30 ENCOUNTER — Ambulatory Visit (HOSPITAL_COMMUNITY)
Admission: EM | Admit: 2015-09-30 | Discharge: 2015-09-30 | Disposition: A | Discharge status: Home / Self Care | Payer: Self-pay | Attending: Internal Medicine | Admitting: Internal Medicine

## 2015-09-30 ENCOUNTER — Encounter (HOSPITAL_COMMUNITY): Payer: Self-pay | Admitting: Emergency Medicine

## 2015-09-30 DIAGNOSIS — G43A0 Cyclical vomiting, not intractable: Secondary | ICD-10-CM

## 2015-09-30 DIAGNOSIS — R1115 Cyclical vomiting syndrome unrelated to migraine: Secondary | ICD-10-CM

## 2015-09-30 NOTE — Discharge Instructions (Signed)
You may go to work tomorrow his lungs you have no vomiting. Be sure to take a good shower prior to going to work. Continue to wash her hands frequently. If you develop vomiting then you should leave work. Follow-up with your primary care doctor as needed.

## 2015-09-30 NOTE — ED Provider Notes (Signed)
CSN: [id number]     Arrival date & time 09/30/15  1224 History   First MD Initiated Contact with Patient 09/30/15 1353     Chief Complaint  Patient presents with  . Emesis   (Consider location/radiation/quality/duration/timing/severity/associated sxs/prior Treatment) Pt st was diagnosed with cyclic vomiting syndrome several yrs ago. For the past few yrs has had approximately 2 episodes the year which tend to last between 5-7 days. This most recent episode started Monday, one week ago and his last episode of vomiting was yesterday. For the past nearly 24 hours she is been able to drink and no more vomiting. Denying abdominal pain. Denying fever or chills. He states he feels a little weak but is gradually regaining his strength and requests to go back to work at the [employer]      Past Medical History:  Diagnosis Date  . Cyclical vomiting   . Depression   . Hypertension   . Substance abuse    [facility]   Past Surgical History:  Procedure Laterality Date  . ESOPHAGOGASTRODUODENOSCOPY  06/08/09   Esophagitis. Gastritis. Bx Negative. - [facility]  . EYE SURGERY     Trauma when child.   Family History  Problem Relation Age of Onset  . Hypertension Mother   . Depression Mother   . Sarcoidosis Mother    Social History  Substance Use Topics  . Smoking status: Former Smoker    Quit date: 01/22/2010  . Smokeless tobacco: Never Used  . Alcohol use Yes     Comment: occasionally    Review of Systems  Constitutional: Positive for activity change. Negative for fever.  HENT: Negative.   Respiratory: Negative.   Cardiovascular: Negative.   Gastrointestinal: Negative for anal bleeding.       Current review of systems regarding GI, see history of present illness.  Genitourinary: Negative.   Musculoskeletal: Negative.   Skin: Negative.   Neurological: Negative.   All other systems reviewed and are negative.   Allergies  Review of patient's allergies indicates no known allergies.  Home  Medications   Prior to Admission medications   Medication Sig Start Date End Date Taking? Authorizing Provider  metoCLOPramide (REGLAN) 10 MG tablet Take 1 tablet (10 mg total) by mouth every 8 (eight) hours as needed for nausea. 12/18/14   Kathee DeltonIan D McKeag, MD  promethazine (PHENERGAN) 25 MG suppository Place 1 suppository (25 mg total) rectally every 6 (six) hours as needed for nausea or vomiting. 12/18/14   Kathee DeltonIan D McKeag, MD  propranolol (INDERAL) 40 MG tablet Take 1 tablet (40 mg total) by mouth 2 (two) times daily. 12/18/14   Kathee DeltonIan D McKeag, MD   Meds Ordered and Administered this Visit  Medications - No data to display  BP 136/87   Pulse 92   Temp 98.6 F (37 C) (Oral)   Resp 16   Ht 5\' 9"  (1.753 m)   Wt 150 lb (68 kg)   SpO2 98%   BMI 22.15 kg/m  No data found.   Physical Exam  Constitutional: He is oriented to person, place, and time. He appears well-developed and well-nourished. No distress.  Eyes: EOM are normal.  Neck: Normal range of motion. Neck supple.  Cardiovascular: Normal rate, regular rhythm, normal heart sounds and intact distal pulses.   Pulmonary/Chest: Effort normal and breath sounds normal. No respiratory distress. He has no wheezes. He has no rales.  Abdominal: Soft. He exhibits no mass. There is no rebound and no guarding.  Minor epigastric  tenderness.  Musculoskeletal: He exhibits no edema.  Neurological: He is alert and oriented to person, place, and time. He exhibits normal muscle tone.  Skin: Skin is warm and dry.  Psychiatric: He has a normal mood and affect.  Nursing note and vitals reviewed.   Urgent Care Course   Clinical Course    Procedures (including critical care time)  Labs Review Labs Reviewed - No data to display  Imaging Review No results found.   Visual Acuity Review  Right Eye Distance:   Left Eye Distance:   Bilateral Distance:    Right Eye Near:   Left Eye Near:    Bilateral Near:         MDM   1.  Non-intractable cyclical vomiting with nausea   Patient is not currently vomiting. He is drinking fluids and trying to rehydrate. He believes that he has enough strength and stamina to go back to work tomorrow and requests to do so. You may go to work tomorrow his lungs you have no vomiting. Be sure to take a good shower prior to going to work. Continue to wash her hands frequently. If you develop vomiting then you should leave work. Follow-up with your primary care doctor as needed.     Hayden Rasmussen, NP 09/30/15 [phone]

## 2015-09-30 NOTE — ED Triage Notes (Signed)
PT has been diagnosed with cyclic vomiting syndrome. PT has been out of work since Monday. PT feels as though condition is now improving and would like a note to return to work. PT has not vomited since yesterday afternoon. PT has tolerated PO intake since then. PT has not attempted to eat, but has been drinking PO fluids.

## 2015-12-21 ENCOUNTER — Encounter (HOSPITAL_COMMUNITY): Payer: Self-pay

## 2015-12-21 ENCOUNTER — Ambulatory Visit (HOSPITAL_COMMUNITY)
Admission: EM | Admit: 2015-12-21 | Discharge: 2015-12-21 | Disposition: A | Discharge status: Home / Self Care | Payer: Self-pay | Attending: Internal Medicine | Admitting: Internal Medicine

## 2015-12-21 DIAGNOSIS — R1115 Cyclical vomiting syndrome unrelated to migraine: Secondary | ICD-10-CM

## 2015-12-21 DIAGNOSIS — G43A0 Cyclical vomiting, not intractable: Secondary | ICD-10-CM

## 2015-12-21 MED ORDER — ONDANSETRON HCL 4 MG/2ML IJ SOLN
4.0000 mg | Freq: Once | INTRAMUSCULAR | Status: AC
  Administered 2015-12-21: 4 mg via INTRAMUSCULAR

## 2015-12-21 MED ORDER — ONDANSETRON HCL 4 MG/2ML IJ SOLN
INTRAMUSCULAR | Status: AC
  Filled 2015-12-21: qty 2

## 2015-12-21 MED ORDER — ONDANSETRON 8 MG PO TBDP: 8.0000 mg | ORAL_TABLET | Freq: Three times a day (TID) | ORAL | 0 refills | Status: DC | PRN

## 2015-12-21 NOTE — ED Triage Notes (Signed)
Pt said he has a hx of cyclic vomiting syndrome since he was 18 but this episode started Tuesday and has been ongoing. Taking propranolol and said zofran does help.

## 2015-12-21 NOTE — Discharge Instructions (Signed)
Nausea and Vomiting, Adult Introduction Feeling sick to your stomach (nausea) means that your stomach is upset or you feel like you have to throw up (vomit). Feeling more and more sick to your stomach can lead to throwing up. Throwing up happens when food and liquid from your stomach are thrown up and out the mouth. Throwing up can make you feel weak and cause you to get dehydrated. Dehydration can make you tired and thirsty, make you have a dry mouth, and make it so you pee (urinate) less often. Older adults and people with other diseases or a weak defense system (immune system) are at higher risk for dehydration. If you feel sick to your stomach or if you throw up, it is important to follow instructions from your doctor about how to take care of yourself. Follow these instructions at home: Eating and drinking Follow these instructions as told by your doctor:  Take an oral rehydration solution (ORS). This is a drink that is sold at pharmacies and stores.  Drink clear fluids in small amounts as you are able, such as:  Water.  Ice chips.  Diluted fruit juice.  Low-calorie sports drinks.  Eat bland, easy-to-digest foods in small amounts as you are able, such as:  Bananas.  Applesauce.  Rice.  Low-fat (lean) meats.  Toast.  Crackers.  Avoid fluids that have a lot of sugar or caffeine in them.  Avoid alcohol.  Avoid spicy or fatty foods. General instructions  Drink enough fluid to keep your pee (urine) clear or pale yellow.  Wash your hands often. If you cannot use soap and water, use hand sanitizer.  Make sure that all people in your home wash their hands well and often.  Take over-the-counter and prescription medicines only as told by your doctor.  Rest at home while you get better.  Watch your condition for any changes.  Breathe slowly and deeply when you feel sick to your stomach.  Keep all follow-up visits as told by your doctor. This is important. Contact a  doctor if:  You have a fever.  You cannot keep fluids down.  Your symptoms get worse.  You have new symptoms.  You feel sick to your stomach for more than two days.  You feel light-headed or dizzy.  You have a headache.  You have muscle cramps. Get help right away if:  You have pain in your chest, neck, arm, or jaw.  You feel very weak or you pass out (faint).  You throw up again and again.  You see blood in your throw-up.  Your throw-up looks like black coffee grounds.  You have bloody or black poop (stools) or poop that look like tar.  You have a very bad headache, a stiff neck, or both.  You have a rash.  You have very bad pain, cramping, or bloating in your belly (abdomen).  You have trouble breathing.  You are breathing very quickly.  Your heart is beating very quickly.  Your skin feels cold and clammy.  You feel confused.  You have pain when you pee.  You have signs of dehydration, such as:  Dark pee, hardly any pee, or no pee.  Cracked lips.  Dry mouth.  Sunken eyes.  Sleepiness.  Weakness. These symptoms may be an emergency. Do not wait to see if the symptoms will go away. Get medical help right away. Call your local emergency services (911 in the [place] Do not drive yourself to the hospital.  This information   is not intended to replace advice given to you by your health care provider. Make sure you discuss any questions you have with your health care provider. Document Released: 06/11/2007 Document Revised: 07/13/2015 Document Reviewed: 08/29/2014  2017 Elsevier  

## 2015-12-22 NOTE — ED Provider Notes (Signed)
CSN: [id number]     Arrival date & time 12/21/15  1004 History   None    Chief Complaint  Patient presents with  . Emesis   (Consider location/radiation/quality/duration/timing/severity/associated sxs/prior Treatment) Patient has been vomiting for several days now.  He states he has condition called cyclic vomiting and is taking propranolol and is having breakthrough.  He has used zofran in the past which helps abort these sx's.   The history is provided by the patient.  Emesis  Severity:  Severe Duration:  4 days Timing:  Constant Number of daily episodes:  4 Quality:  Stomach contents Able to tolerate:  Solids How soon after eating does vomiting occur:  4 hours Progression:  Worsening Chronicity:  Recurrent Recent urination:  Increased Relieved by:  Nothing Worsened by:  Nothing Ineffective treatments:  None tried   Past Medical History:  Diagnosis Date  . Cyclical vomiting   . Depression   . Hypertension   . Substance abuse    [facility]   Past Surgical History:  Procedure Laterality Date  . ESOPHAGOGASTRODUODENOSCOPY  06/08/09   Esophagitis. Gastritis. Bx Negative. - [facility]  . EYE SURGERY     Trauma when child.   Family History  Problem Relation Age of Onset  . Hypertension Mother   . Depression Mother   . Sarcoidosis Mother    Social History  Substance Use Topics  . Smoking status: Former Smoker    Quit date: 01/22/2010  . Smokeless tobacco: Never Used  . Alcohol use Yes     Comment: occasionally    Review of Systems  Constitutional: Negative.   HENT: Negative.   Eyes: Negative.   Respiratory: Negative.   Cardiovascular: Negative.   Gastrointestinal: Positive for vomiting.  Endocrine: Negative.   Genitourinary: Negative.   Musculoskeletal: Negative.   Skin: Negative.   Allergic/Immunologic: Negative.   Neurological: Negative.   Hematological: Negative.   Psychiatric/Behavioral: Negative.     Allergies  Patient has no known allergies.  Home  Medications   Prior to Admission medications   Medication Sig Start Date End Date Taking? Authorizing Provider  propranolol (INDERAL) 40 MG tablet Take 1 tablet (40 mg total) by mouth 2 (two) times daily. 12/18/14  Yes Kathee DeltonIan D McKeag, MD  metoCLOPramide (REGLAN) 10 MG tablet Take 1 tablet (10 mg total) by mouth every 8 (eight) hours as needed for nausea. 12/18/14   Kathee DeltonIan D McKeag, MD  ondansetron (ZOFRAN ODT) 8 MG disintegrating tablet Take 1 tablet (8 mg total) by mouth every 8 (eight) hours as needed for nausea or vomiting. 12/21/15   Deatra CanterWilliam J Rhett Mutschler, FNP  promethazine (PHENERGAN) 25 MG suppository Place 1 suppository (25 mg total) rectally every 6 (six) hours as needed for nausea or vomiting. 12/18/14   Kathee DeltonIan D McKeag, MD   Meds Ordered and Administered this Visit   Medications  ondansetron [facility](ZOFRAN) injection 4 mg (4 mg Intramuscular Given 12/21/15 1129)    BP (!) 168/104 (BP Location: Left Arm)   Pulse 97   Temp 99.5 F (37.5 C) (Oral)   Resp 16   SpO2 100%  No data found.   Physical Exam  Constitutional: He appears well-developed and well-nourished.  HENT:  Head: Normocephalic and atraumatic.  Right Ear: External ear normal.  Left Ear: External ear normal.  Mouth/Throat: Oropharynx is clear and moist.  Eyes: Conjunctivae and EOM are normal. Pupils are equal, round, and reactive to light.  Neck: Normal range of motion. Neck supple.  Cardiovascular: Normal rate, regular  rhythm and normal heart sounds.   Pulmonary/Chest: Effort normal and breath sounds normal.  Abdominal: Soft.  Nursing note and vitals reviewed.   Urgent Care Course   Clinical Course     Procedures (including critical care time)  Labs Review Labs Reviewed - No data to display  Imaging Review No results found.   Visual Acuity Review  Right Eye Distance:   Left Eye Distance:   Bilateral Distance:    Right Eye Near:   Left Eye Near:    Bilateral Near:         MDM   1. Non-intractable  cyclical vomiting without nausea    Zofran 8mg  po tid prn #30  Push po fluids, rest, tylenol and motrin otc prn as directed for fever, arthralgias, and myalgias.  Follow up prn if sx's continue or persist.    Deatra CanterWilliam J Gradie Ohm, FNP 12/22/15 [phone]

## 2016-02-22 ENCOUNTER — Telehealth: Payer: Self-pay | Admitting: Family Medicine

## 2016-02-22 NOTE — Telephone Encounter (Signed)
No answer, left voicemail. Last office visit in 12/2014. Patient has overdue Hm: History of hypertension, Tdap, Flu vaccine.

## 2016-03-14 ENCOUNTER — Ambulatory Visit (HOSPITAL_COMMUNITY)
Admission: EM | Admit: 2016-03-14 | Discharge: 2016-03-14 | Disposition: A | Discharge status: Home / Self Care | Payer: Self-pay | Attending: Emergency Medicine | Admitting: Emergency Medicine

## 2016-03-14 ENCOUNTER — Encounter (HOSPITAL_COMMUNITY): Payer: Self-pay | Admitting: *Deleted

## 2016-03-14 DIAGNOSIS — R1115 Cyclical vomiting syndrome unrelated to migraine: Secondary | ICD-10-CM

## 2016-03-14 DIAGNOSIS — E876 Hypokalemia: Secondary | ICD-10-CM

## 2016-03-14 DIAGNOSIS — G43A0 Cyclical vomiting, not intractable: Secondary | ICD-10-CM

## 2016-03-14 LAB — POCT I-STAT, CHEM 8
BUN: 5 mg/dL — AB (ref 6–20)
CALCIUM ION: 1.09 mmol/L — AB (ref 1.15–1.40)
Chloride: 86 mmol/L — ABNORMAL LOW (ref 101–111)
Creatinine, Ser: 1 mg/dL (ref 0.61–1.24)
Glucose, Bld: 126 mg/dL — ABNORMAL HIGH (ref 65–99)
HCT: 55 % — ABNORMAL HIGH (ref 39.0–52.0)
HEMOGLOBIN: 18.7 g/dL — AB (ref 13.0–17.0)
Potassium: 3 mmol/L — ABNORMAL LOW (ref 3.5–5.1)
Sodium: 139 mmol/L (ref 135–145)
TCO2: 29 mmol/L (ref 0–100)

## 2016-03-14 MED ORDER — ONDANSETRON 4 MG PO TBDP
8.0000 mg | ORAL_TABLET | Freq: Once | ORAL | Status: AC
  Administered 2016-03-14: 8 mg via ORAL

## 2016-03-14 MED ORDER — ONDANSETRON 4 MG PO TBDP
ORAL_TABLET | ORAL | Status: AC
  Filled 2016-03-14: qty 2

## 2016-03-14 MED ORDER — ONDANSETRON 8 MG PO TBDP: 8.0000 mg | ORAL_TABLET | Freq: Three times a day (TID) | ORAL | 0 refills | Status: AC | PRN

## 2016-03-14 MED ORDER — POTASSIUM CHLORIDE CRYS ER 20 MEQ PO TBCR
40.0000 meq | EXTENDED_RELEASE_TABLET | Freq: Once | ORAL | Status: AC
  Administered 2016-03-14: 40 meq via ORAL

## 2016-03-14 MED ORDER — POTASSIUM CHLORIDE CRYS ER 20 MEQ PO TBCR
EXTENDED_RELEASE_TABLET | ORAL | Status: AC
  Filled 2016-03-14: qty 2

## 2016-03-14 NOTE — ED Triage Notes (Signed)
Started          vomitiing  5  Days  Ago       Vomiting  Subsided  yest   No  Diarrhea          Constipated

## 2016-03-14 NOTE — Discharge Instructions (Signed)
Increase your intake of potassium rich foods or push electrolyte containing fluids such as Pedialyte and Gatorade.

## 2016-03-14 NOTE — ED Provider Notes (Signed)
HPI  SUBJECTIVE:  Samuel Robertson is a 26 y.o. male who presents with 5 days of nausea and nonbilious, nonbloody vomiting. States that he couldn't tolerate any by mouth for the first few days, and was vomiting every hour. Is now tolerating fluids. He states the last episode of emesis was last night, but he reports persistent nausea. He reports diffuse, intermittent, crampy, dull, minutes long abdominal pain typical of cyclic vomiting flares, and states that it is improving overall. He denies chest concussions of breath, change in urine output, abdominal distention. No diarrhea, melena, hematochezia. There are no aggravating or alleviating factors. He has not tried anything for this. States that Zofran has worked well for him in the past. He smokes marijuana, but states that it is not associated with the vomiting. Past medical history of hypertension, cyclical vomiting, marijuana use. He has been seen here 6 times since 2016 for the same. Prescribed Zofran last time. Has not taken his propanolol today. He has not measured his blood pressure this week. PMD: [facility].   Past Surgical History:  Procedure Laterality Date  . ESOPHAGOGASTRODUODENOSCOPY  06/08/09   Esophagitis. Gastritis. Bx Negative. - [facility]  . EYE SURGERY     Trauma when child.    Family History  Problem Relation Age of Onset  . Hypertension Mother   . Depression Mother   . Sarcoidosis Mother     Social History  Substance Use Topics  . Smoking status: Former Smoker    Quit date: 01/22/2010  . Smokeless tobacco: Never Used  . Alcohol use Yes     Comment: occasionally     Current Facility-Administered Medications:  .  ondansetron (ZOFRAN-ODT) disintegrating tablet 8 mg, 8 mg, Oral, Once, Domenick Gong, MD .  potassium chloride SA (K-DUR,KLOR-CON) CR tablet 40 mEq, 40 mEq, Oral, Once, Domenick Gong, MD  Current Outpatient Prescriptions:  .  ondansetron (ZOFRAN ODT) 8 MG disintegrating tablet, Take 1  tablet (8 mg total) by mouth every 8 (eight) hours as needed for nausea or vomiting., Disp: 30 tablet, Rfl: 0 .  propranolol (INDERAL) 40 MG tablet, Take 1 tablet (40 mg total) by mouth 2 (two) times daily., Disp: 60 tablet, Rfl: 5  No Known Allergies   ROS  As noted in HPI.   Physical Exam  Temp 98.8 F (37.1 C) (Oral)   SpO2 99%   Orthostatic VS for the past 24 hrs:  BP- Lying Pulse- Lying BP- Sitting Pulse- Sitting BP- Standing at 0 minutes Pulse- Standing at 0 minutes  03/14/16 1101 (!) 161/119 93 (!) 160/120 105 (!) 151/118 108    Constitutional: Well developed, well nourished, no acute distress Eyes: PERRL, EOMI, conjunctiva normal bilaterally HENT: Normocephalic, atraumatic,mucus membranes moist Respiratory: Clear to auscultation bilaterally, no rales, no wheezing, no rhonchi Cardiovascular: Mild regular tachycardia, no murmurs, no gallops, no rubs. Refill approximately 2 seconds GI: Normal appearance, Soft, nondistended, normal bowel sounds, nontender, no rebound, no guarding negative Murphy negative McBurney Back: no CVAT skin: No rash, skin intact Musculoskeletal: No edema, no tenderness, no deformities Neurologic: Alert & oriented x 3, CN II-XII grossly intact, no motor deficits, sensation grossly intact Psychiatric: Speech and behavior appropriate   ED Course   Medications  potassium chloride SA (K-DUR,KLOR-CON) CR tablet 40 mEq (not administered)  ondansetron (ZOFRAN-ODT) disintegrating tablet 8 mg (not administered)    Orders Placed This Encounter  Procedures  . I-STAT, chem 8    Standing Status:   Standing    Number of  Occurrences:   1   Results for orders placed or performed during the hospital encounter of 03/14/16 (from the past 24 hour(s))  I-STAT, chem 8     Status: Abnormal   Collection Time: 03/14/16 11:11 AM  Result Value Ref Range   Sodium 139 135 - 145 mmol/L   Potassium 3.0 (L) 3.5 - 5.1 mmol/L   Chloride 86 (L) 101 - 111 mmol/L   BUN 5  (L) 6 - 20 mg/dL   Creatinine, Ser 1.321.00 0.61 - 1.24 mg/dL   Glucose, Bld 440126 (H) 65 - 99 mg/dL   Calcium, Ion 1.021.09 (L) 1.15 - 1.40 mmol/L   TCO2 29 0 - 100 mmol/L   Hemoglobin 18.7 (H) 13.0 - 17.0 g/dL   HCT 72.555.0 (H) 36.639.0 - 44.052.0 %   No results found.  ED Clinical Impression  Non-intractable cyclical vomiting with nausea  Hypokalemia   ED Assessment/Plan  Previous records reviewed. As noted in history of present illness.  i-STAT shows hypokalemia, hypochloremia which is expected, hemoconcentration. BUN and creatinine are otherwise unremarkable.  He is slightly tachycardic, but he is not orthostatic. He does not appear significantly dehydrated. He also states that he has not taken his propanolol today.  Giving 40 mEq of potassium KCl by mouth here x 1 and 8 mg of Zofran. Patient is drinking a bottle of green tea. Patient does not have evidence of a surgical abdomen and states that this is identical to previous cyclical vomiting syndrome episodes. His not vomited since last night. We will send home with Zofran 8 mg 3 times a day 10 days when necessary, advised increase of electrolyte containing fluids. Follow up with PMD as needed to the ER if he gets worse  Discussed labs,  MDM, plan and followup with patient . Discussed sn/sx that should prompt return to the ED. Patient agrees with plan.   Meds ordered this encounter  Medications  . potassium chloride SA (K-DUR,KLOR-CON) CR tablet 40 mEq  . ondansetron (ZOFRAN-ODT) disintegrating tablet 8 mg  . ondansetron (ZOFRAN ODT) 8 MG disintegrating tablet    Sig: Take 1 tablet (8 mg total) by mouth every 8 (eight) hours as needed for nausea or vomiting.    Dispense:  30 tablet    Refill:  0    *This clinic note was created using Scientist, clinical (histocompatibility and immunogenetics)Dragon dictation software. Therefore, there may be occasional mistakes despite careful proofreading.  ?   Domenick GongAshley Rydell Wiegel, MD 03/14/16 [phone]

## 2016-04-13 ENCOUNTER — Ambulatory Visit (HOSPITAL_COMMUNITY)
Admission: EM | Admit: 2016-04-13 | Discharge: 2016-04-13 | Disposition: A | Discharge status: Home / Self Care | Payer: Self-pay | Attending: Emergency Medicine | Admitting: Emergency Medicine

## 2016-04-13 ENCOUNTER — Encounter (HOSPITAL_COMMUNITY): Payer: Self-pay | Admitting: Emergency Medicine

## 2016-04-13 DIAGNOSIS — G43A0 Cyclical vomiting, not intractable: Secondary | ICD-10-CM

## 2016-04-13 DIAGNOSIS — E876 Hypokalemia: Secondary | ICD-10-CM

## 2016-04-13 DIAGNOSIS — R1115 Cyclical vomiting syndrome unrelated to migraine: Secondary | ICD-10-CM

## 2016-04-13 LAB — POCT I-STAT, CHEM 8
BUN: 24 mg/dL — AB (ref 6–20)
CREATININE: 1.1 mg/dL (ref 0.61–1.24)
Calcium, Ion: 1.08 mmol/L — ABNORMAL LOW (ref 1.15–1.40)
Chloride: 96 mmol/L — ABNORMAL LOW (ref 101–111)
Glucose, Bld: 128 mg/dL — ABNORMAL HIGH (ref 65–99)
HCT: 52 % (ref 39.0–52.0)
Hemoglobin: 17.7 g/dL — ABNORMAL HIGH (ref 13.0–17.0)
Potassium: 2.9 mmol/L — ABNORMAL LOW (ref 3.5–5.1)
Sodium: 141 mmol/L (ref 135–145)
TCO2: 31 mmol/L (ref 0–100)

## 2016-04-13 MED ORDER — ONDANSETRON 4 MG PO TBDP: 8.0000 mg | ORAL_TABLET | Freq: Three times a day (TID) | ORAL | 1 refills | Status: DC | PRN

## 2016-04-13 MED ORDER — POTASSIUM CHLORIDE CRYS ER 20 MEQ PO TBCR
EXTENDED_RELEASE_TABLET | ORAL | Status: AC
  Filled 2016-04-13: qty 2

## 2016-04-13 MED ORDER — ONDANSETRON 4 MG PO TBDP
ORAL_TABLET | ORAL | Status: AC
  Filled 2016-04-13: qty 2

## 2016-04-13 MED ORDER — POTASSIUM CHLORIDE ER 10 MEQ PO TBCR: 10.0000 meq | EXTENDED_RELEASE_TABLET | Freq: Every day | ORAL | 0 refills | Status: DC

## 2016-04-13 MED ORDER — POTASSIUM CHLORIDE CRYS ER 20 MEQ PO TBCR
40.0000 meq | EXTENDED_RELEASE_TABLET | Freq: Once | ORAL | Status: AC
  Administered 2016-04-13: 40 meq via ORAL

## 2016-04-13 MED ORDER — ONDANSETRON 4 MG PO TBDP
8.0000 mg | ORAL_TABLET | Freq: Once | ORAL | Status: AC
  Administered 2016-04-13: 8 mg via ORAL

## 2016-04-13 NOTE — ED Provider Notes (Signed)
CSN: [id number]     Arrival date & time 04/13/16  1206 History   First MD Initiated Contact with Patient 04/13/16 1303     Chief Complaint  Patient presents with  . Emesis   (Consider location/radiation/quality/duration/timing/severity/associated sxs/prior Treatment) 26 year old male presents to clinic for evaluation of a week long period of nausea and vomiting. He states he has prior diagnosis of cyclic vomiting syndrome, diagnosed at the age of 66, and that he has had multiple episodes in the past similar to this. States she has been unable to eat over the past week, however he has been able to keep some fluids down, he is drinking water and Gatorade. He denies any visible blood or bilious material in his emesis, is not experiencing any acute abdominal pain. He states that usually the only thing that "helped some" he has Zofran. He does admit to recreational marijuana use, states 2-3 times a week. He is complaining of weakness, dizziness, denies any heart palpitations or chest pain.   The history is provided by the patient.  Emesis  Severity:  Severe Duration:  5 days Quality:  Stomach contents Able to tolerate:  Liquids Progression:  Unchanged Chronicity:  Chronic Recent urination:  Normal Relieved by:  Antiemetics Exacerbated by: changes in temperature. Associated symptoms: no abdominal pain, no chills, no cough, no diarrhea, no fever, no headaches and no myalgias     Past Medical History:  Diagnosis Date  . Cyclical vomiting   . Depression   . Hypertension   . Substance abuse    THC   Past Surgical History:  Procedure Laterality Date  . ESOPHAGOGASTRODUODENOSCOPY  06/08/09   Esophagitis. Gastritis. Bx Negative. - [facility]  . EYE SURGERY     Trauma when child.   Family History  Problem Relation Age of Onset  . Hypertension Mother   . Depression Mother   . Sarcoidosis Mother    Social History  Substance Use Topics  . Smoking status: Former Smoker    Quit date: 01/22/2010   . Smokeless tobacco: Never Used  . Alcohol use Yes     Comment: occasionally    Review of Systems  Constitutional: Negative for chills and fever.  Respiratory: Negative for cough and shortness of breath.   Cardiovascular: Negative for chest pain, palpitations and leg swelling.  Gastrointestinal: Positive for nausea and vomiting. Negative for abdominal pain and diarrhea.  Genitourinary: Negative for dysuria, frequency and urgency.  Musculoskeletal: Negative for myalgias, neck pain and neck stiffness.  Neurological: Negative for headaches.    Allergies  Patient has no known allergies.  Home Medications   Prior to Admission medications   Medication Sig Start Date End Date Taking? Authorizing Provider  ondansetron (ZOFRAN ODT) 4 MG disintegrating tablet Take 2 tablets (8 mg total) by mouth every 8 (eight) hours as needed for nausea or vomiting. 04/13/16   Dorena Bodo, NP  potassium chloride (K-DUR) 10 MEQ tablet Take 1 tablet (10 mEq total) by mouth daily. 04/13/16   Dorena Bodo, NP   Meds Ordered and Administered this Visit   Medications  ondansetron (ZOFRAN-ODT) disintegrating tablet 8 mg (8 mg Oral Given 04/13/16 1325)  potassium chloride SA (K-DUR,KLOR-CON) CR tablet 40 mEq (40 mEq Oral Given 04/13/16 1347)    BP 108/68 (BP Location: Right Arm)   Pulse 85   Temp 98.7 F (37.1 C) (Oral)   Resp 16   SpO2 97%  Orthostatic VS for the past 24 hrs:  BP- Lying Pulse- Lying BP- Sitting Pulse-  Sitting BP- Standing at 0 minutes Pulse- Standing at 0 minutes  04/13/16 1412 117/83 74 110/84 75 118/85 95    Physical Exam  Constitutional: He is oriented to person, place, and time. He appears well-developed and well-nourished. No distress.  HENT:  Head: Normocephalic.  Right Ear: External ear normal.  Left Ear: External ear normal.  Eyes: Conjunctivae are normal. Right eye exhibits no discharge. Left eye exhibits no discharge.  Neck: Normal range of motion.  Cardiovascular:  Normal rate and regular rhythm.   Pulmonary/Chest: Effort normal and breath sounds normal.  Abdominal: Soft. Bowel sounds are normal. He exhibits no distension. There is no tenderness. There is no rebound and no guarding.  Neurological: He is alert and oriented to person, place, and time.  Skin: Skin is warm and dry. Capillary refill takes less than 2 seconds. He is not diaphoretic.  Psychiatric: He has a normal mood and affect. His behavior is normal.  Nursing note and vitals reviewed.   Urgent Care Course     ED EKG Date/Time: 04/13/2016 5:30 PM Performed by: Dorena Bodo Authorized by: Dorena Bodo   ECG reviewed by ED Physician in the absence of a cardiologist: no   Previous ECG:    Previous ECG:  Unavailable Interpretation:    Interpretation: non-specific   Rate:    ECG rate:  70   ECG rate assessment: normal   Rhythm:    Rhythm: sinus rhythm   Ectopy:    Ectopy: none   QRS:    QRS axis:  Normal   QRS intervals:  Normal Conduction:    Conduction: normal   ST segments:    ST segments:  Normal T waves:    T waves: non-specific   Comments:     PR 112 ms QRS 92 ms QT/QTc 384/141 ms   (including critical care time)  Labs Review Labs Reviewed  POCT I-STAT, CHEM 8 - Abnormal; Notable for the following:       Result Value   Potassium 2.9 (*)    Chloride 96 (*)    BUN 24 (*)    Glucose, Bld 128 (*)    Calcium, Ion 1.08 (*)    Hemoglobin 17.7 (*)    All other components within normal limits    Imaging Review No results found.      MDM   1. Non-intractable cyclical vomiting with nausea   2. Hypokalemia, gastrointestinal losses    Patient was given potassium chloride orally in clinic, and 8 mg of Zofran. 12-lead was reviewed, no ectopy noted. Patient was prescribed Zofran, and potassium. Strict follow-up guidelines were provided, encouraged him to go to the emergency room at any time if he feels any chest pain, palpitations, muscle cramping,  tingling, or any other markers of electrolyte disturbances. Strongly encouraged to force fluids, water, Gatorade, Pedialyte. Follow up with a primary care provider if symptoms persist or go to the emergency room at any time if they worsen.     Dorena Bodo, NP 04/13/16 1733

## 2016-04-13 NOTE — ED Triage Notes (Signed)
The patient presented to the [facility] with a complaint of vomiting x 1 week.

## 2016-04-13 NOTE — Discharge Instructions (Signed)
Your potassium level was critically low, we have given you potassium in clinic, and I have prescribed an oral potassium replacement to take at home. Take one tablet daily. If it any time you experience chest pain, heart palpitations, or other cardiac symptoms, go to the emergency room as soon as possible.  You also showing symptoms of dehydration, I recommend drinking Gatorade, water, you may 50-50 mixture frequently throughout the day. For your vomiting, I have prescribed Zofran, take 2 tablets under the tongue every 8 hours as needed. I also recommend you stop consuming marijuana, as this can worsen your symptoms. If at any time should her symptoms worsen, or fail to resolve, go to the emergency room as soon as possible.

## 2016-10-17 ENCOUNTER — Encounter (HOSPITAL_COMMUNITY): Payer: Self-pay | Admitting: Physician Assistant

## 2016-10-17 ENCOUNTER — Ambulatory Visit (HOSPITAL_COMMUNITY)
Admission: EM | Admit: 2016-10-17 | Discharge: 2016-10-17 | Disposition: A | Discharge status: Home / Self Care | Payer: Self-pay | Attending: Family Medicine | Admitting: Family Medicine

## 2016-10-17 DIAGNOSIS — R1115 Cyclical vomiting syndrome unrelated to migraine: Secondary | ICD-10-CM

## 2016-10-17 DIAGNOSIS — G43A1 Cyclical vomiting, intractable: Secondary | ICD-10-CM

## 2016-10-17 LAB — POCT I-STAT, CHEM 8
BUN: 16 mg/dL (ref 6–20)
Calcium, Ion: 1.12 mmol/L — ABNORMAL LOW (ref 1.15–1.40)
Chloride: 96 mmol/L — ABNORMAL LOW (ref 101–111)
Creatinine, Ser: 0.9 mg/dL (ref 0.61–1.24)
Glucose, Bld: 121 mg/dL — ABNORMAL HIGH (ref 65–99)
HEMATOCRIT: 51 % (ref 39.0–52.0)
Hemoglobin: 17.3 g/dL — ABNORMAL HIGH (ref 13.0–17.0)
POTASSIUM: 3 mmol/L — AB (ref 3.5–5.1)
SODIUM: 138 mmol/L (ref 135–145)
TCO2: 28 mmol/L (ref 22–32)

## 2016-10-17 NOTE — Discharge Instructions (Signed)
Your lab work showed low potassium, given you are no longer vomiting and without nausea, will have you replenish with food as potassium tablets can cause nausea. I have attached information on diet that can help replenish your diet. Otherwise, no alarming signs today. You are slightly dehydrated, which is expected from your symptoms, continue to replenish fluids, your urine should be clear to pale yellow in color. Please follow up with PCP for further evaluation and treatment as they are able to provide referrals and continued care, which is needed for a chronic/recurrent condition. If experiencing worsening of symptoms, chest pain, shortness of breath, continued nausea/vomiting not treated with medications, weakness/dizziness, go to the emergency department for further evaluation.

## 2016-10-17 NOTE — ED Triage Notes (Addendum)
Pt states he was diagnosed with "cyclical vomiting" a long time ago by his family practice.  Pt has missed a week of work because of this.  He states he stopped vomiting yesterday and needs a note for work.  It was explained to him that he can get a note that he was seen here today, but that we are unable to excuse him from work for the last week as we did not see him or treat him in the last week.  He states he took PO Zofran at home but it does not really work for him.  He usually has to have a Zofran injection.

## 2016-10-17 NOTE — ED Provider Notes (Signed)
[facility]    CSN: [id number] Arrival date & time: 10/17/16  1307     History   Chief Complaint Chief Complaint  Patient presents with  . Emesis    HPI Samuel Robertson is a 26 y.o. male.   26 year old male with history of depression, hypertension, cyclical vomiting, THC use comes in for 1 week history of vomiting. Patient states he has been using his zofran ODT without relief, he states that he usually requires zofran IM to improve symptoms. Also states he was on phenergan in the past that did not improve symptoms either. Last had zofran ODT 2 days ago, last vomit 2 days ago. Mild abdominal pain that is intermittent in nature. Has been able to drink without a problem, with Gatorade, ginger ale, water. Has not tried eating. Has had some weakness and dizziness, but has since resolved and feeling better for the past day. Patient has not been able to go to work this past week due to his symptoms. Denies current nausea. Denies hematemesis, hematochezia. Denies diarrhea or constipation. Denies syncope, chest pain, palpitations. Some shortness of breath with laying down due to mucus build up. History of THC use, denies any other type of drug use. Denies THC starting vomiting episodes, last used yesterday.       Past Medical History:  Diagnosis Date  . Cyclical vomiting   . Depression   . Hypertension   . Substance abuse (HCC)    THC    Patient Active Problem List   Diagnosis Date Noted  . Screen for STD (sexually transmitted disease) 07/08/2011  . Cyclical vomiting 04/23/2010  . DEPRESSION 03/06/2009  . Essential hypertension, benign 12/20/2008    Past Surgical History:  Procedure Laterality Date  . ESOPHAGOGASTRODUODENOSCOPY  06/08/09   Esophagitis. Gastritis. Bx Negative. - [facility]  . EYE SURGERY     Trauma when child.       Home Medications    Prior to Admission medications   Medication Sig Start Date End Date Taking? Authorizing Provider  ondansetron  (ZOFRAN ODT) 4 MG disintegrating tablet Take 2 tablets (8 mg total) by mouth every 8 (eight) hours as needed for nausea or vomiting. 04/13/16  Yes Dorena Bodo, NP  potassium chloride (K-DUR) 10 MEQ tablet Take 1 tablet (10 mEq total) by mouth daily. 04/13/16   Dorena Bodo, NP    Family History Family History  Problem Relation Age of Onset  . Hypertension Mother   . Depression Mother   . Sarcoidosis Mother     Social History Social History  Substance Use Topics  . Smoking status: Former Smoker    Quit date: 01/22/2010  . Smokeless tobacco: Never Used  . Alcohol use Yes     Comment: occasionally     Allergies   Patient has no known allergies.   Review of Systems Review of Systems  Reason unable to perform ROS: See HPI as above.     Physical Exam Triage Vital Signs ED Triage Vitals [10/17/16 1323]  Enc Vitals Group     BP 127/88     Pulse Rate (!) 104     Resp      Temp 99.2 F (37.3 C)     Temp Source Oral     SpO2 97 %     Weight      Height      Head Circumference      Peak Flow      Pain Score  Pain Loc      Pain Edu?      Excl. in GC?    Orthostatic VS for the past 24 hrs:  BP- Lying Pulse- Lying BP- Sitting Pulse- Sitting BP- Standing at 0 minutes Pulse- Standing at 0 minutes  10/17/16 1406 120/80 101 121/88 95 117/79 113    Updated Vital Signs BP 127/88 (BP Location: Right Arm)   Pulse (!) 104   Temp 99.2 F (37.3 C) (Oral)   SpO2 97%    Physical Exam  Constitutional: He is oriented to person, place, and time. He appears well-developed and well-nourished. No distress.  HENT:  Head: Normocephalic and atraumatic.  Eyes: Pupils are equal, round, and reactive to light. Conjunctivae are normal.  Cardiovascular: Regular rhythm and normal heart sounds.  Tachycardia present.  Exam reveals no gallop and no friction rub.   No murmur heard. Pulmonary/Chest: Effort normal and breath sounds normal. No respiratory distress. He has no wheezes.  He has no rales.  Abdominal: Soft. Bowel sounds are normal. There is no tenderness. There is no rebound and no guarding.  Neurological: He is alert and oriented to person, place, and time.  Skin: Skin is warm and dry.     UC Treatments / Results  Labs (all labs ordered are listed, but only abnormal results are displayed) Labs Reviewed  POCT I-STAT, CHEM 8 - Abnormal; Notable for the following:       Result Value   Potassium 3.0 (*)    Chloride 96 (*)    Glucose, Bld 121 (*)    Calcium, Ion 1.12 (*)    Hemoglobin 17.3 (*)    All other components within normal limits    EKG  EKG Interpretation None       Radiology No results found.  Procedures Procedures (including critical care time)  Medications Ordered in UC Medications - No data to display   Initial Impression / Assessment and Plan / UC Course  I have reviewed the triage vital signs and the nursing notes.  Pertinent labs & imaging results that were available during my care of the patient were reviewed by me and considered in my medical decision making (see chart for details).    No alarming signs on exam. As patient without current nausea and no vomiting for the past 2 days, no indication of zofran IM. Review of notes in the past without zofran IM use and during those visits, patient has stated zofran PO/ODT improved symptoms. Istat shows potassium of 3.0, given patient without nausea and vomiting, will have patient replenish through diet. Orthostatic showed tachycardia with standing, but without drop in BP and symptoms of weakness/dizziness. Patient to push fluids PO. Patient states has not followed up with PCP for quite some while, discussed importance of PCP follow up and continuation of care given frequent episodes of cyclic vomiting. Patient expresses understanding and agrees to plan.  Case discussed with Dr Milus Glazier, who agrees to plan.   Final Clinical Impressions(s) / UC Diagnoses   Final diagnoses:    Intractable cyclical vomiting with nausea    New Prescriptions New Prescriptions   No medications on file      Belinda Fisher, Cordelia Poche 10/17/16 1422

## 2017-04-01 ENCOUNTER — Ambulatory Visit (HOSPITAL_COMMUNITY)
Admission: EM | Admit: 2017-04-01 | Discharge: 2017-04-01 | Disposition: A | Discharge status: Home / Self Care | Payer: Self-pay | Attending: Family Medicine | Admitting: Family Medicine

## 2017-04-01 ENCOUNTER — Encounter (HOSPITAL_COMMUNITY): Payer: Self-pay | Admitting: Emergency Medicine

## 2017-04-01 DIAGNOSIS — G43A0 Cyclical vomiting, not intractable: Secondary | ICD-10-CM

## 2017-04-01 DIAGNOSIS — R1115 Cyclical vomiting syndrome unrelated to migraine: Secondary | ICD-10-CM

## 2017-04-01 MED ORDER — ONDANSETRON 4 MG PO TBDP: 4.0000 mg | ORAL_TABLET | Freq: Three times a day (TID) | ORAL | 0 refills | Status: DC | PRN

## 2017-04-01 MED ORDER — ONDANSETRON HCL 4 MG/2ML IJ SOLN
4.0000 mg | Freq: Once | INTRAMUSCULAR | Status: AC
Start: 2017-04-01 — End: 2017-04-01
  Administered 2017-04-01: 4 mg via INTRAMUSCULAR

## 2017-04-01 MED ORDER — ONDANSETRON HCL 4 MG/2ML IJ SOLN
INTRAMUSCULAR | Status: AC
  Filled 2017-04-01: qty 2

## 2017-04-01 NOTE — Discharge Instructions (Addendum)
Meds ordered this encounter  Medications   ondansetron (ZOFRAN) injection 4 mg   Please do your best to ensure adequate fluid intake in order to avoid dehydration. If you find that you are unable to tolerate drinking fluids regularly please proceed to the Emergency Department for evaluation.  Also, you should return to the hospital if you experience persistent fevers for greater than 1-2 more days, increasing abdominal pain that persists despite medications, persistent diarrhea, dizziness, syncope (fainting), or for any other concerns you may deem worrisome.

## 2017-04-01 NOTE — ED Triage Notes (Signed)
Pt sts vomiting x 3 days with hx of cyclic vomiting

## 2017-04-08 NOTE — ED Provider Notes (Signed)
[facility]   [id number] 04/01/17 Arrival Time: 1403  ASSESSMENT & PLAN:  1. Non-intractable cyclical vomiting with nausea     Meds ordered this encounter  Medications  . ondansetron (ZOFRAN) injection 4 mg  . ondansetron (ZOFRAN-ODT) 4 MG disintegrating tablet    Sig: Take 1 tablet (4 mg total) by mouth every 8 (eight) hours as needed for nausea or vomiting.    Dispense:  15 tablet    Refill:  0   Discussed typical duration of symptoms for suspected viral GI illness. Will do his best to ensure adequate fluid intake in order to avoid dehydration. Will proceed to the Emergency Department for evaluation if unable to tolerate PO fluids regularly.  Otherwise he will f/u with his PCP or here if not showing improvement over the next 48-72 hours.  Reviewed expectations re: course of current medical issues. Questions answered. Outlined signs and symptoms indicating need for more acute intervention. Patient verbalized understanding. After Visit Summary given.   SUBJECTIVE: History from: patient.  Samuel Robertson is a 27 y.o. male who presents with complaint of non-bloody intermittent nausea and vomiting of brown material without diarrhea. Onset gradual, about 3 days ago. Abdominal discomfort: none. Symptoms are gradually improving since beginning. Aggravating factors: eating. Alleviating factors: none. Associated symptoms: fatigue. He denies fever. Appetite: decreased. PO intake: decreased. Ambulatory without assistance. Urinary symptoms: none. Last bowel movement yesterday without blood. H/O similar symptoms. See PMH. OTC treatment: none.   Past Surgical History:  Procedure Laterality Date  . ESOPHAGOGASTRODUODENOSCOPY  06/08/09   Esophagitis. Gastritis. Bx Negative. - [facility]  . EYE SURGERY     Trauma when child.    ROS: As per HPI.  OBJECTIVE:  Vitals:   04/01/17 1503  BP: (!) 166/120  Pulse: 90  Resp: 18  Temp: 98.8 F (37.1 C)  TempSrc: Oral  SpO2: 100%    General appearance: alert; no distress Oropharynx: moist Lungs: clear to auscultation bilaterally Heart: regular rate and rhythm Abdomen: soft; non-distended; no significant abdominal tenderness, "just a cramping feeling"; bowel sounds present; no masses or organomegaly; no guarding or rebound tenderness Back: no CVA tenderness Extremities: no edema; symmetrical with no gross deformities Skin: warm and dry Neurologic: normal gait Psychological: alert and cooperative; normal mood and affect   No Known Allergies                                             Past Medical History:  Diagnosis Date  . Cyclical vomiting   . Depression   . Hypertension   . Substance abuse (HCC)    [facility]   Social History   Socioeconomic History  . Marital status: Single    Spouse name: Not on file  . Number of children: Not on file  . Years of education: Not on file  . Highest education level: Not on file  Occupational History  . Not on file  Social Needs  . Financial resource strain: Not on file  . Food insecurity:    Worry: Not on file    Inability: Not on file  . Transportation needs:    Medical: Not on file    Non-medical: Not on file  Tobacco Use  . Smoking status: Former Smoker    Last attempt to quit: 01/22/2010    Years since quitting: 7.2  . Smokeless tobacco: Never Used  Substance  and Sexual Activity  . Alcohol use: Yes    Comment: occasionally  . Drug use: Yes    Types: Marijuana  . Sexual activity: Not on file  Lifestyle  . Physical activity:    Days per week: Not on file    Minutes per session: Not on file  . Stress: Not on file  Relationships  . Social connections:    Talks on phone: Not on file    Gets together: Not on file    Attends religious service: Not on file    Active member of club or organization: Not on file    Attends meetings of clubs or organizations: Not on file    Relationship status: Not on file  . Intimate partner violence:    Fear of current or ex  partner: Not on file    Emotionally abused: Not on file    Physically abused: Not on file    Forced sexual activity: Not on file  Other Topics Concern  . Not on file  Social History Narrative  . Not on file   Family History  Problem Relation Age of Onset  . Hypertension Mother   . Depression Mother   . Sarcoidosis Mother      Mardella LaymanHagler, Ferry Matthis, MD 04/08/17 [phone]

## 2017-08-03 ENCOUNTER — Ambulatory Visit (INDEPENDENT_AMBULATORY_CARE_PROVIDER_SITE_OTHER): Payer: BLUE CROSS/BLUE SHIELD | Admitting: Family Medicine

## 2017-08-03 VITALS — BP 160/100 | HR 84 | Temp 98.3°F | Ht 68.0 in | Wt 143.6 lb

## 2017-08-03 DIAGNOSIS — Z202 Contact with and (suspected) exposure to infections with a predominantly sexual mode of transmission: Secondary | ICD-10-CM

## 2017-08-03 DIAGNOSIS — I10 Essential (primary) hypertension: Secondary | ICD-10-CM | POA: Diagnosis not present

## 2017-08-03 DIAGNOSIS — G43A0 Cyclical vomiting, not intractable: Secondary | ICD-10-CM | POA: Diagnosis not present

## 2017-08-03 DIAGNOSIS — R1115 Cyclical vomiting syndrome unrelated to migraine: Secondary | ICD-10-CM

## 2017-08-03 MED ORDER — LISINOPRIL 10 MG PO TABS: 10.0000 mg | ORAL_TABLET | Freq: Every day | ORAL | 3 refills | Status: DC

## 2017-08-03 MED ORDER — AZITHROMYCIN 500 MG PO TABS
1000.0000 mg | ORAL_TABLET | Freq: Once | ORAL | Status: AC
  Administered 2017-08-03: 1000 mg via ORAL

## 2017-08-03 MED ORDER — AZITHROMYCIN 250 MG PO TABS: 1000.0000 mg | ORAL_TABLET | Freq: Once | ORAL | Status: DC

## 2017-08-03 NOTE — Patient Instructions (Addendum)
It was great to meet you today! Thank you for letting me participate in your care!  Today, we discussed several things. First, your blood pressure is high and we need to get it under better control to avoid potential health issues that will develop in the future if we do not act now. I have started you on Lisinopril to take once a day. I will test your electrolytes today and retest it in one month to ensure the new medication is not having any adverse effects.  I am treating you for exposure to Chlamydia. I am also testing you today for other STDs and will inform you if the results are abnormal.  You are scheduled for a toenail removal on 08/17/2017.   Be well, Jules Schickim Avaneesh Pepitone, DO PGY-2, [facility]

## 2017-08-03 NOTE — Assessment & Plan Note (Signed)
No medication needed today as patient has seen marked improvement with diet and exercise.

## 2017-08-03 NOTE — Assessment & Plan Note (Signed)
Treating for Chlamydia exposure today with 1g of Azithromycin. Will have repeat testing in one month to ensure he was successfully treated. Obtaining RPR and HIV today as well.  His partner has already been treated.

## 2017-08-03 NOTE — Progress Notes (Signed)
Subjective: Chief Complaint  Patient presents with  . Annual Exam    HPI: Samuel Robertson is a 27 y.o. presenting to clinic today to discuss the following:  Cyclic Vomiting Syndrome Improving. He states his episodes have been less frequent and he tend to not last as long. He has a job at [employer] and has focused on eating better. He has had 2-3 episodes so far this year.  HTN Patient was hypertensive at previous visit several years ago and started on Propanolol as it was thought part of his HTN was due to anxiety and stress. He is not stressed or anxious and with improvement with his episodeds of emesis he still has elevated blood pressure. Positive family history of HTN.  STD Testing Patient states he had unprotected sex with his girlfriend and she later tested positive for Chlamydia. He would like STD testing today. Informed patient he would be treated for Chlamydia without need to test since he was exposed.   He denies fever, chills, vision changes, headaches, abdominal pain, diarrhea, constipation, blood in the stool, penile discharge or pain, pain or burning on urination, frequent urination.  He does endorse some episodes of nausea and vomiting.  Health Maintenance: Tetanus     ROS noted in HPI.   Past Medical, Surgical, Social, and Family History Reviewed & Updated per EMR.   Pertinent Historical Findings include:   Social History   Tobacco Use  Smoking Status Former Smoker  . Last attempt to quit: 01/22/2010  . Years since quitting: 7.5  Smokeless Tobacco Never Used    Objective: BP (!) 160/100 (BP Location: Left Arm, Patient Position: Sitting, Cuff Size: Normal)   Pulse 84   Temp 98.3 F (36.8 C) (Oral)   Ht 5\' 8"  (1.727 m)   Wt 143 lb 9.6 oz (65.1 kg)   SpO2 99%   BMI 21.83 kg/m  Vitals and nursing notes reviewed  Physical Exam Gen: Alert and Oriented x 3, NAD HEENT: Normocephalic, atraumatic, PERRLA, EOMI, TM visible with good light reflex,  non-swollen, non-erythematous turbinates, non-erythematous pharyngeal mucosa, no exudates Neck: trachea midline, no thyroidmegaly, no LAD CV: RRR, no murmurs, normal S1, S2 split, +2 pulses dorsalis pedis bilaterally Resp: CTAB, no wheezing, rales, or rhonchi, comfortable work of breathing Abd: non-distended, non-tender, soft, +bs in all four quadrants MSK: Moves all four extremities, 2nd toenail bilaterally are ingrown Ext: no clubbing, cyanosis, or edema Neuro: CN II-XII grossly intact, no focal or gross deficits Skin: warm, dry, intact, no rashes  No results found for this or any previous visit (from the past 72 hour(s)).  Assessment/Plan:  Cyclical vomiting No medication needed today as patient has seen marked improvement with diet and exercise.  Essential hypertension, benign Patient has not been taking medications and has several documented episodes of uncontrolled BP. It is elevated today as well. Starting Lisinopril 10mg  today as it is markedly elevated. Checking BMP now and will recheck in one month. Considered CCB first since he is African American and will switch if he does not tolerate Lisinopril.  STD exposure Treating for Chlamydia exposure today with 1g of Azithromycin. Will have repeat testing in one month to ensure he was successfully treated. Obtaining RPR and HIV today as well.  His partner has already been treated.   PATIENT EDUCATION PROVIDED: See AVS    Diagnosis and plan along with any newly prescribed medication(s) were discussed in detail with this patient today. The patient verbalized understanding and agreed with the  plan. Patient advised if symptoms worsen return to clinic or ER.   Health Maintainance:   Orders Placed This Encounter  Procedures  . Basic Metabolic Panel  . HIV antibody (with reflex)  . RPR    Meds ordered this encounter  Medications  . lisinopril (PRINIVIL,ZESTRIL) 10 MG tablet    Sig: Take 1 tablet (10 mg total) by mouth daily.      Dispense:  90 tablet    Refill:  3  . DISCONTD: azithromycin (ZITHROMAX) tablet 1,000 mg  . azithromycin (ZITHROMAX) tablet 1,000 mg     Jules Schickim Trenna Kiely, DO 08/03/2017, 10:17 AM PGY-2 [facility]

## 2017-08-03 NOTE — Assessment & Plan Note (Signed)
Patient has not been taking medications and has several documented episodes of uncontrolled BP. It is elevated today as well. Starting Lisinopril 10mg  today as it is markedly elevated. Checking BMP now and will recheck in one month. Considered CCB first since he is African American and will switch if he does not tolerate Lisinopril.

## 2017-08-04 LAB — BASIC METABOLIC PANEL
BUN/Creatinine Ratio: 12 (ref 9–20)
BUN: 11 mg/dL (ref 6–20)
CALCIUM: 9.1 mg/dL (ref 8.7–10.2)
CHLORIDE: 103 mmol/L (ref 96–106)
CO2: 24 mmol/L (ref 20–29)
Creatinine, Ser: 0.91 mg/dL (ref 0.76–1.27)
GFR calc non Af Amer: 115 mL/min/{1.73_m2} (ref >59)
GFR, EST AFRICAN AMERICAN: 133 mL/min/{1.73_m2} (ref >59)
GLUCOSE: 102 mg/dL — AB (ref 65–99)
POTASSIUM: 4.4 mmol/L (ref 3.5–5.2)
Sodium: 140 mmol/L (ref 134–144)

## 2017-08-04 LAB — HIV ANTIBODY (ROUTINE TESTING W REFLEX): HIV SCREEN 4TH GENERATION: NONREACTIVE

## 2017-08-04 LAB — RPR: RPR Ser Ql: NONREACTIVE

## 2017-08-24 ENCOUNTER — Other Ambulatory Visit: Payer: Self-pay

## 2017-08-24 ENCOUNTER — Ambulatory Visit (INDEPENDENT_AMBULATORY_CARE_PROVIDER_SITE_OTHER): Payer: BLUE CROSS/BLUE SHIELD | Admitting: Family Medicine

## 2017-08-24 ENCOUNTER — Encounter: Payer: Self-pay | Admitting: Family Medicine

## 2017-08-24 VITALS — BP 116/80 | HR 81 | Temp 98.5°F | Ht 68.0 in | Wt 147.0 lb

## 2017-08-24 DIAGNOSIS — B351 Tinea unguium: Secondary | ICD-10-CM | POA: Diagnosis not present

## 2017-08-24 DIAGNOSIS — L600 Ingrowing nail: Secondary | ICD-10-CM | POA: Insufficient documentation

## 2017-08-24 MED ORDER — TERBINAFINE HCL 250 MG PO TABS
250.0000 mg | ORAL_TABLET | Freq: Every day | ORAL | 2 refills | Status: DC
Start: 2017-08-24 — End: 2017-08-24

## 2017-08-24 MED ORDER — TERBINAFINE HCL 250 MG PO TABS: 250.0000 mg | ORAL_TABLET | Freq: Every day | ORAL | 2 refills | Status: DC

## 2017-08-24 NOTE — Patient Instructions (Addendum)

## 2017-08-24 NOTE — Progress Notes (Signed)
     Subjective: Chief Complaint  Patient presents with  . toe nail removal     HPI: Samuel Robertson is a 27 y.o. presenting to clinic today to discuss the following:  Onchymycosis with Overgrown Toenail Patient returns for treatment of painful 2nd toenail on each foot. He states the pain has gotten better but he is having difficulty trimming his nails. On inspection today his toenails do not appear to be ingrown but he continues to have onychomycosis with discolored toenails.  No other issues or symptoms at this time. His cyclical vomiting is still controlled with diet and infrequent use of zofran.  Health Maintenance: none today     ROS noted in HPI.   Past Medical, Surgical, Social, and Family History Reviewed & Updated per EMR.   Pertinent Historical Findings include:   Social History   Tobacco Use  Smoking Status Former Smoker  . Last attempt to quit: 01/22/2010  . Years since quitting: 7.5  Smokeless Tobacco Never Used    Objective: BP 116/80   Pulse 81   Temp 98.5 F (36.9 C) (Oral)   Ht 5\' 8"  (1.727 m)   Wt 147 lb (66.7 kg)   SpO2 98%   BMI 22.35 kg/m  Vitals and nursing notes reviewed  Physical Exam Gen: Alert and Oriented x 3, NAD CV: RRR, no murmurs, normal S1, S2 split Resp: CTAB, no wheezing, rales, or rhonchi, comfortable work of breathing MSK: Moves all four extremities, 2nd digit toenail bilaterally discolored and overgrown Ext: no clubbing, cyanosis, or edema Skin: warm, dry, intact, no rashes  No results found for this or any previous visit (from the past 72 hour(s)).  Assessment/Plan:  Onychomycosis CMP today and starting Lamisil 250mg  daily. Trimmed both toenails today and discussed with patient to keep them trimmed. Also informed patient that this will likely take at least 3 months to completely treat and he must take medication everyday.  Patient expressed understanding and agreement with plan.   PATIENT EDUCATION PROVIDED: See  AVS    Diagnosis and plan along with any newly prescribed medication(s) were discussed in detail with this patient today. The patient verbalized understanding and agreed with the plan. Patient advised if symptoms worsen return to clinic or ER.   Health Maintainance:   Orders Placed This Encounter  Procedures  . Comprehensive metabolic panel    Meds ordered this encounter  Medications  . DISCONTD: terbinafine (LAMISIL) 250 MG tablet    Sig: Take 1 tablet (250 mg total) by mouth daily.    Dispense:  30 tablet    Refill:  2  . DISCONTD: terbinafine (LAMISIL) 250 MG tablet    Sig: Take 1 tablet (250 mg total) by mouth daily.    Dispense:  30 tablet    Refill:  2  . terbinafine (LAMISIL) 250 MG tablet    Sig: Take 1 tablet (250 mg total) by mouth daily.    Dispense:  30 tablet    Refill:  2     Jules Schickim Lauriel Helin, DO 08/24/2017, 10:05 AM PGY-2 [facility]

## 2017-08-24 NOTE — Assessment & Plan Note (Signed)
CMP today and starting Lamisil 250mg  daily. Trimmed both toenails today and discussed with patient to keep them trimmed. Also informed patient that this will likely take at least 3 months to completely treat and he must take medication everyday.  Patient expressed understanding and agreement with plan.

## 2017-08-25 LAB — COMPREHENSIVE METABOLIC PANEL
A/G RATIO: 2.3 — AB (ref 1.2–2.2)
ALT: 12 IU/L (ref 0–44)
AST: 17 IU/L (ref 0–40)
Albumin: 4.8 g/dL (ref 3.5–5.5)
Alkaline Phosphatase: 61 IU/L (ref 39–117)
BILIRUBIN TOTAL: 1 mg/dL (ref 0.0–1.2)
BUN/Creatinine Ratio: 10 (ref 9–20)
BUN: 10 mg/dL (ref 6–20)
CHLORIDE: 102 mmol/L (ref 96–106)
CO2: 24 mmol/L (ref 20–29)
Calcium: 9.6 mg/dL (ref 8.7–10.2)
Creatinine, Ser: 0.98 mg/dL (ref 0.76–1.27)
GFR calc Af Amer: 121 mL/min/{1.73_m2} (ref >59)
GFR, EST NON AFRICAN AMERICAN: 104 mL/min/{1.73_m2} (ref >59)
GLOBULIN, TOTAL: 2.1 g/dL (ref 1.5–4.5)
Glucose: 63 mg/dL — ABNORMAL LOW (ref 65–99)
POTASSIUM: 4.2 mmol/L (ref 3.5–5.2)
SODIUM: 142 mmol/L (ref 134–144)
Total Protein: 6.9 g/dL (ref 6.0–8.5)

## 2017-09-03 ENCOUNTER — Other Ambulatory Visit: Payer: Self-pay

## 2017-09-03 ENCOUNTER — Encounter: Payer: Self-pay | Admitting: Family Medicine

## 2017-09-03 ENCOUNTER — Ambulatory Visit (INDEPENDENT_AMBULATORY_CARE_PROVIDER_SITE_OTHER): Payer: BLUE CROSS/BLUE SHIELD | Admitting: Family Medicine

## 2017-09-03 DIAGNOSIS — I10 Essential (primary) hypertension: Secondary | ICD-10-CM | POA: Diagnosis not present

## 2017-09-03 NOTE — Patient Instructions (Signed)
It was great to see you today! Thank you for letting me participate in your care!  Today, we discussed your blood pressure and it is well controlled on your current medicaiton. I suggest periodically checking your BP with a home BP monitor to ensure it remains well controlled. Please make sure your right arm is at the level of your heart and you have been sitting down resting for at least 15 minutes to get an accurate measurement.  Otherwise, continue your healthy eating lifestyle and use of Zofran as needed to control your symptoms of cyclic vomiting syndrome. You are doing very well so keep up the good work!  Be well, Jules Schickim Shiah Berhow, DO PGY-2, [facility]

## 2017-09-03 NOTE — Assessment & Plan Note (Signed)
BP well controlled on Lisinopril 10mg  daily. Advised to continue checking BP periodically and to return to clinic if he experiences any symptoms.

## 2017-09-03 NOTE — Progress Notes (Signed)
     Subjective: Chief Complaint  Patient presents with  . Follow-up    BP   HPI: Samuel Robertson is a 27 y.o. presenting to clinic today to discuss the following:  BP Check Patient presents today for BP follow up due to having a high BP at last visit. He has been taking his mediation as prescribed and denies headache, vision changes, syncope, or near syncope. It is well controlled today and at goal on 10mg  of Lisinopril.  Health Maintenance: declines Tetanus     ROS noted in HPI.   Past Medical, Surgical, Social, and Family History Reviewed & Updated per EMR.   Pertinent Historical Findings include:   Social History   Tobacco Use  Smoking Status Former Smoker  . Last attempt to quit: 01/22/2010  . Years since quitting: 7.6  Smokeless Tobacco Never Used    Objective: BP 112/68   Pulse 75   Temp 98.7 F (37.1 C) (Oral)   Ht 5\' 8"  (1.727 m)   Wt 150 lb (68 kg)   SpO2 98%   BMI 22.81 kg/m  Vitals and nursing notes reviewed  Physical Exam Gen: Alert and Oriented x 3, NAD HEENT: Normocephalic, atraumatic CV: RRR, no murmurs, normal S1, S2 split Resp: CTAB, no wheezing, rales, or rhonchi, comfortable work of breathing MSK: Moves all four extremities Ext: no clubbing, cyanosis, or edema Skin: warm, dry, intact, no rashes  No results found for this or any previous visit (from the past 72 hour(s)).  Assessment/Plan:  Essential hypertension, benign BP well controlled on Lisinopril 10mg  daily. Advised to continue checking BP periodically and to return to clinic if he experiences any symptoms.    PATIENT EDUCATION PROVIDED: See AVS    Diagnosis and plan along with any newly prescribed medication(s) were discussed in detail with this patient today. The patient verbalized understanding and agreed with the plan. Patient advised if symptoms worsen return to clinic or ER.   Health Maintainance:   No orders of the defined types were placed in this encounter.   No  orders of the defined types were placed in this encounter.    Samuel Schickim Maxamillion Banas, DO 09/03/2017, 1:31 PM PGY-2 [facility]

## 2019-04-07 ENCOUNTER — Encounter (HOSPITAL_COMMUNITY): Payer: Self-pay

## 2019-04-07 ENCOUNTER — Ambulatory Visit (HOSPITAL_COMMUNITY)
Admission: EM | Admit: 2019-04-07 | Discharge: 2019-04-07 | Disposition: A | Discharge status: Home / Self Care | Payer: BC Managed Care – PPO | Attending: Family Medicine | Admitting: Family Medicine

## 2019-04-07 ENCOUNTER — Other Ambulatory Visit: Payer: Self-pay

## 2019-04-07 DIAGNOSIS — Z20822 Contact with and (suspected) exposure to covid-19: Secondary | ICD-10-CM | POA: Insufficient documentation

## 2019-04-07 DIAGNOSIS — R1115 Cyclical vomiting syndrome unrelated to migraine: Secondary | ICD-10-CM | POA: Diagnosis not present

## 2019-04-07 DIAGNOSIS — Z8249 Family history of ischemic heart disease and other diseases of the circulatory system: Secondary | ICD-10-CM | POA: Diagnosis not present

## 2019-04-07 DIAGNOSIS — Z87891 Personal history of nicotine dependence: Secondary | ICD-10-CM | POA: Insufficient documentation

## 2019-04-07 DIAGNOSIS — I10 Essential (primary) hypertension: Secondary | ICD-10-CM | POA: Diagnosis not present

## 2019-04-07 DIAGNOSIS — J309 Allergic rhinitis, unspecified: Secondary | ICD-10-CM

## 2019-04-07 DIAGNOSIS — F329 Major depressive disorder, single episode, unspecified: Secondary | ICD-10-CM | POA: Insufficient documentation

## 2019-04-07 LAB — COMPREHENSIVE METABOLIC PANEL
ALT: 16 U/L (ref 0–44)
AST: 17 U/L (ref 15–41)
Albumin: 4.8 g/dL (ref 3.5–5.0)
Alkaline Phosphatase: 54 U/L (ref 38–126)
Anion gap: 11 (ref 5–15)
BUN: 16 mg/dL (ref 6–20)
CO2: 30 mmol/L (ref 22–32)
Calcium: 9.7 mg/dL (ref 8.9–10.3)
Chloride: 102 mmol/L (ref 98–111)
Creatinine, Ser: 0.84 mg/dL (ref 0.61–1.24)
GFR calc Af Amer: >60 mL/min (ref >60)
GFR calc non Af Amer: >60 mL/min (ref >60)
Glucose, Bld: 125 mg/dL — ABNORMAL HIGH (ref 70–99)
Potassium: 3.6 mmol/L (ref 3.5–5.1)
Sodium: 143 mmol/L (ref 135–145)
Total Bilirubin: 1.1 mg/dL (ref 0.3–1.2)
Total Protein: 8.1 g/dL (ref 6.5–8.1)

## 2019-04-07 LAB — CBC
HCT: 49.6 % (ref 39.0–52.0)
Hemoglobin: 17 g/dL (ref 13.0–17.0)
MCH: 29.8 pg (ref 26.0–34.0)
MCHC: 34.3 g/dL (ref 30.0–36.0)
MCV: 86.9 fL (ref 80.0–100.0)
Platelets: 250 10*3/uL (ref 150–400)
RBC: 5.71 MIL/uL (ref 4.22–5.81)
RDW: 12.1 % (ref 11.5–15.5)
WBC: 5.6 10*3/uL (ref 4.0–10.5)
nRBC: 0 % (ref 0.0–0.2)

## 2019-04-07 MED ORDER — FLUTICASONE PROPIONATE 50 MCG/ACT NA SUSP: 1.0000 | Freq: Every day | NASAL | 0 refills | Status: AC

## 2019-04-07 MED ORDER — ONDANSETRON 4 MG PO TBDP: 4.0000 mg | ORAL_TABLET | Freq: Three times a day (TID) | ORAL | 0 refills | Status: AC | PRN

## 2019-04-07 MED ORDER — LISINOPRIL 10 MG PO TABS: 10.0000 mg | ORAL_TABLET | Freq: Every day | ORAL | 0 refills | Status: DC

## 2019-04-07 MED ORDER — SUCRALFATE 1 GM/10ML PO SUSP: 1.0000 g | Freq: Three times a day (TID) | ORAL | 0 refills | Status: AC

## 2019-04-07 MED ORDER — FAMOTIDINE 20 MG PO TABS: 20.0000 mg | ORAL_TABLET | Freq: Two times a day (BID) | ORAL | 0 refills | Status: DC

## 2019-04-07 MED ORDER — CETIRIZINE HCL 10 MG PO TABS: 10.0000 mg | ORAL_TABLET | Freq: Every day | ORAL | 0 refills | Status: AC

## 2019-04-07 NOTE — Discharge Instructions (Addendum)
Take the Zofran. Use the sucralfate times a day Take the famotidine twice a day Use the Zyrtec and Flonase daily  Increase your water intake as well as include Pedialyte.  Avoid reds, purple is an orange color beverages.  If you are unable to tolerate liquids despite treatment will need to go to the emergency department as I am afraid he will become significantly dehydrated.  If you develop severe abdominal pain begin vomiting blood or have dark coffee-ground appearance this would also be an indication to report to the emergency department.  We sent some lab work and I will notify you of any very concerning findings otherwise these will be found in your MyChart.  I want you to restart your lisinopril tomorrow.  Please follow-up with the primary care attached.  I have also sent in a referral for gastroenterology.  If you have not heard from them by early next week please call out office  If your Covid-19 test is positive, you will receive a phone call from [facility] regarding your results. Negative test results are not called. Both positive and negative results area always visible on MyChart. If you do not have a MyChart account, sign up instructions are in your discharge papers.   Persons who are directed to care for themselves at home may discontinue isolation under the following conditions:   At least 10 days have passed since symptom onset and  At least 24 hours have passed without running a fever (this means without the use of fever-reducing medications) and  Other symptoms have improved.  Persons infected with COVID-19 who never develop symptoms may discontinue isolation and other precautions 10 days after the date of their first positive COVID-19 test.

## 2019-04-07 NOTE — ED Provider Notes (Addendum)
[facility]    CSN: [id number] Arrival date & time: 04/07/19  1629      History   Chief Complaint Chief Complaint  Patient presents with  . Emesis    HPI Samuel Robertson is a 29 y.o. male.   Patient with a history of cyclic vomiting syndrome presents with 1 week history of nausea, vomiting abdominal pain.  He reports 1 week ago he began vomiting and was unable to tolerate anything by mouth for 3 to 4 days.  However over the last 3 to 4 days he has been able to tolerate some liquids.  He has not eaten much solid food.  He does report initially that he might of had dark coffee ground like vomiting but this is resolved.  Denies any blood in his vomit.  Reports generally feels unwell today in clinic with fatigue  and body ache.  Reports some mid abdominal discomfort that he describes as " in his stomach" with a feeling of being very hungry.  Reports when he is had episodes like this in the past he has gotten symptomatic treatment and these subside over time.  Reports that this lasted a little bit longer previously followed with gastroenterologist however lost benefits over the last year would like to be relinked with a gastroenterologist.  No significant drug use though he does report smoking marijuana but he has not had a significant amount lately.  Denies alcohol use.  So concerned about his blood pressure is elevated.  He reports he was previously on lisinopril but does not fill this prescription since being in the year due to losing insurance.  He reports nasal congestion that has been going on for a while which he believes is impacting his breathing at night.  Also reports some chest pressure at night makes him feel short of breath.  He denies shortness of breath or chest pain in clinic currently.        Past Medical History:  Diagnosis Date  . Cyclical vomiting   . Depression   . Hypertension   . Substance abuse (HCC)    [facility]    Patient Active Problem List   Diagnosis Date Noted  . Ingrown toenail 08/24/2017  . Onychomycosis 08/24/2017  . STD exposure 08/03/2017  . Screen for STD (sexually transmitted disease) 07/08/2011  . Cyclical vomiting 04/23/2010  . DEPRESSION 03/06/2009  . Essential hypertension, benign 12/20/2008    Past Surgical History:  Procedure Laterality Date  . ESOPHAGOGASTRODUODENOSCOPY  06/08/09   Esophagitis. Gastritis. Bx Negative. - [facility]  . EYE SURGERY     Trauma when child.       Home Medications    Prior to Admission medications   Medication Sig Start Date End Date Taking? Authorizing Provider  cetirizine (ZYRTEC ALLERGY) 10 MG tablet Take 1 tablet (10 mg total) by mouth daily. 04/07/19 05/07/19  Nainika Newlun, Veryl Speak, PA-C  famotidine (PEPCID) 20 MG tablet Take 1 tablet (20 mg total) by mouth 2 (two) times daily. 04/07/19   Jasmine Mcbeth, Veryl Speak, PA-C  fluticasone (FLONASE) 50 MCG/ACT nasal spray Place 1 spray into both nostrils daily. 04/07/19   Rafay Dahan, Veryl Speak, PA-C  lisinopril (ZESTRIL) 10 MG tablet Take 1 tablet (10 mg total) by mouth daily. 04/07/19   Leighla Chestnutt, Veryl Speak, PA-C  ondansetron (ZOFRAN-ODT) 4 MG disintegrating tablet Take 1 tablet (4 mg total) by mouth every 8 (eight) hours as needed for nausea or vomiting. 04/07/19   Kaevon Cotta, Veryl Speak, PA-C  sucralfate (CARAFATE) 1  GM/10ML suspension Take 10 mLs (1 g total) by mouth 4 (four) times daily -  with meals and at bedtime. 04/07/19   Petrice Beedy, Veryl Speak, PA-C  terbinafine (LAMISIL) 250 MG tablet Take 1 tablet (250 mg total) by mouth daily. 08/24/17   Arlyce Harman, DO    Family History Family History  Problem Relation Age of Onset  . Hypertension Mother   . Depression Mother   . Sarcoidosis Mother     Social History Social History   Tobacco Use  . Smoking status: Former Smoker    Quit date: 01/22/2010    Years since quitting: 9.2  . Smokeless tobacco: Never Used  Substance Use Topics  . Alcohol use: Yes    Comment: occasionally  . Drug use: Yes    Types: Marijuana      Allergies   Patient has no known allergies.   Review of Systems Review of Systems  Constitutional: Positive for fatigue. Negative for chills and fever.  HENT: Positive for congestion and rhinorrhea. Negative for ear pain, sinus pressure, sinus pain and sore throat.   Eyes: Negative for pain and visual disturbance.  Respiratory: Positive for chest tightness. Negative for cough and shortness of breath.   Cardiovascular: Negative for chest pain and palpitations.  Gastrointestinal: Positive for diarrhea, nausea and vomiting. Negative for abdominal pain, blood in stool and constipation.  Genitourinary: Negative.   Musculoskeletal: Negative for arthralgias, back pain and myalgias.  Skin: Negative for color change and rash.  Neurological: Negative for seizures, syncope and headaches.  All other systems reviewed and are negative.    Physical Exam Triage Vital Signs ED Triage Vitals  Enc Vitals Group     BP 04/07/19 1723 (!) 163/117     Pulse Rate 04/07/19 1723 85     Resp 04/07/19 1723 16     Temp 04/07/19 1723 98.4 F (36.9 C)     Temp Source 04/07/19 1723 Oral     SpO2 04/07/19 1723 100 %     Weight 04/07/19 1724 145 lb (65.8 kg)     Height 04/07/19 1724 5\' 11"  (1.803 m)     Head Circumference --      Peak Flow --      Pain Score 04/07/19 1724 4     Pain Loc --      Pain Edu? --      Excl. in GC? --    No data found.  Updated Vital Signs BP (!) 163/117   Pulse 85   Temp 98.4 F (36.9 C) (Oral)   Resp 16   Ht 5\' 11"  (1.803 m)   Wt 145 lb (65.8 kg)   SpO2 100%   BMI 20.22 kg/m   Visual Acuity Right Eye Distance:   Left Eye Distance:   Bilateral Distance:    Right Eye Near:   Left Eye Near:    Bilateral Near:     Physical Exam Vitals and nursing note reviewed.  Constitutional:      General: He is not in acute distress.    Appearance: He is well-developed. He is ill-appearing. He is not toxic-appearing or diaphoretic.  HENT:     Head: Normocephalic  and atraumatic.     Right Ear: Tympanic membrane normal.     Left Ear: Tympanic membrane normal.     Nose:     Comments: Bilateral erythematous and swollen turbinates with clear nasal discharge    Mouth/Throat:     Mouth: Mucous membranes are moist.  Pharynx: Oropharynx is clear.  Eyes:     Extraocular Movements: Extraocular movements intact.     Conjunctiva/sclera: Conjunctivae normal.     Pupils: Pupils are equal, round, and reactive to light.  Cardiovascular:     Rate and Rhythm: Normal rate and regular rhythm.     Heart sounds: No murmur.  Pulmonary:     Effort: Pulmonary effort is normal. No respiratory distress.     Breath sounds: Normal breath sounds. No wheezing, rhonchi or rales.  Abdominal:     Palpations: Abdomen is soft.     Tenderness: There is abdominal tenderness (Mild periumbilical and lower epigastric tenderness).  Musculoskeletal:     Cervical back: Neck supple.     Right lower leg: No edema.     Left lower leg: No edema.  Lymphadenopathy:     Cervical: No cervical adenopathy.  Skin:    General: Skin is warm and dry.     Coloration: Skin is not jaundiced.     Findings: No rash.  Neurological:     General: No focal deficit present.     Mental Status: He is alert and oriented to person, place, and time.      UC Treatments / Results  Labs (all labs ordered are listed, but only abnormal results are displayed) Labs Reviewed  COMPREHENSIVE METABOLIC PANEL - Abnormal; Notable for the following components:      Result Value   Glucose, Bld 125 (*)    All other components within normal limits  SARS CORONAVIRUS 2 (TAT 6-24 HRS)  CBC    EKG   Radiology No results found.  Procedures Procedures (including critical care time)  Medications Ordered in UC Medications - No data to display  Initial Impression / Assessment and Plan / UC Course  I have reviewed the triage vital signs and the nursing notes.  Pertinent labs & imaging results that were  available during my care of the patient were reviewed by me and considered in my medical decision making (see chart for details).   Chart review was conducted from visit 2020 and 2019 for similar complaints.    #Non-intractable cyclical vomiting syndrome #Allergic rhinitis Patient 29 year old male presenting with 1 week duration of nausea and vomiting.  Vomit is mostly resided however he continues to feel quite ill possibility of return of vomiting.  He reports this is similar to previous episodes however this was lasted a little bit longer.  CMP and CBC all reassuring without electrolyte derangement kidney functions are good.  Will discharge with Zofran, Carafate and famotidine for GI symptoms.  For congestion symptoms will start on Zyrtec and Flonase.  Refill lisinopril to restart tomorrow.  Patient to follow-up with primary care and ambulatory referral to GI was placed.  Strict return precautions and ED precautions for continued frequent vomiting or severe abdominal pain.  Patient verbalized understanding   Final Clinical Impressions(s) / UC Diagnoses   Final diagnoses:  Non-intractable cyclical vomiting with nausea  Allergic rhinitis, unspecified seasonality, unspecified trigger     Discharge Instructions     Take the Zofran. Use the sucralfate times a day Take the famotidine twice a day Use the Zyrtec and Flonase daily  Increase your water intake as well as include Pedialyte.  Avoid reds, purple is an orange color beverages.  If you are unable to tolerate liquids despite treatment will need to go to the emergency department as I am afraid he will become significantly dehydrated.  If you develop severe abdominal pain  begin vomiting blood or have dark coffee-ground appearance this would also be an indication to report to the emergency department.  We sent some lab work and I will notify you of any very concerning findings otherwise these will be found in your MyChart.  I want you  to restart your lisinopril tomorrow.  Please follow-up with the primary care attached.  I have also sent in a referral for gastroenterology.  If you have not heard from them by early next week please call out office  If your Covid-19 test is positive, you will receive a phone call from [facility] regarding your results. Negative test results are not called. Both positive and negative results area always visible on MyChart. If you do not have a MyChart account, sign up instructions are in your discharge papers.   Persons who are directed to care for themselves at home may discontinue isolation under the following conditions:  . At least 10 days have passed since symptom onset and . At least 24 hours have passed without running a fever (this means without the use of fever-reducing medications) and . Other symptoms have improved.  Persons infected with COVID-19 who never develop symptoms may discontinue isolation and other precautions 10 days after the date of their first positive COVID-19 test.       ED Prescriptions    Medication Sig Dispense Auth. Provider   lisinopril (ZESTRIL) 10 MG tablet Take 1 tablet (10 mg total) by mouth daily. 30 tablet Namita Yearwood, Veryl Speak, PA-C   ondansetron (ZOFRAN-ODT) 4 MG disintegrating tablet Take 1 tablet (4 mg total) by mouth every 8 (eight) hours as needed for nausea or vomiting. 15 tablet Koni Kannan, Veryl Speak, PA-C   sucralfate (CARAFATE) 1 GM/10ML suspension Take 10 mLs (1 g total) by mouth 4 (four) times daily -  with meals and at bedtime. 420 mL Trish Mancinelli, Veryl Speak, PA-C   famotidine (PEPCID) 20 MG tablet Take 1 tablet (20 mg total) by mouth 2 (two) times daily. 30 tablet Lizbet Cirrincione, Veryl Speak, PA-C   fluticasone (FLONASE) 50 MCG/ACT nasal spray Place 1 spray into both nostrils daily. 11.1 mL Riane Rung, Veryl Speak, PA-C   cetirizine (ZYRTEC ALLERGY) 10 MG tablet Take 1 tablet (10 mg total) by mouth daily. 30 tablet Krissia Schreier, Veryl Speak, PA-C     PDMP not reviewed this encounter.   Hermelinda Medicus, PA-C 04/07/19 2015    Dash Cardarelli, Veryl Speak, PA-C 04/07/19 2016

## 2019-04-07 NOTE — ED Triage Notes (Signed)
Pt states a few yrs ago he was dx w/cyclic vomiting syndrome. Pt c/o vomiting from last Thurs through Tuesday. Pt states he had diarrhea the first day. Pt states he's been hypertensive and having abdominal pain.

## 2019-04-08 LAB — SARS CORONAVIRUS 2 (TAT 6-24 HRS): SARS Coronavirus 2: NEGATIVE

## 2019-04-13 ENCOUNTER — Telehealth: Payer: Self-pay | Admitting: Family Medicine

## 2019-04-13 NOTE — Telephone Encounter (Signed)
Recall List Call - LVM to schedule f/u appt for HTN w/Dr. Karen Chafe. Last seen 09/03/2017.

## 2019-04-14 ENCOUNTER — Ambulatory Visit (INDEPENDENT_AMBULATORY_CARE_PROVIDER_SITE_OTHER): Payer: BC Managed Care – PPO | Admitting: Gastroenterology

## 2019-04-14 ENCOUNTER — Encounter: Payer: Self-pay | Admitting: Gastroenterology

## 2019-04-14 VITALS — BP 100/70 | HR 73 | Temp 98.5°F | Ht 70.0 in | Wt 152.0 lb

## 2019-04-14 DIAGNOSIS — R1115 Cyclical vomiting syndrome unrelated to migraine: Secondary | ICD-10-CM | POA: Diagnosis not present

## 2019-04-14 DIAGNOSIS — F1290 Cannabis use, unspecified, uncomplicated: Secondary | ICD-10-CM

## 2019-04-14 DIAGNOSIS — Z01818 Encounter for other preprocedural examination: Secondary | ICD-10-CM

## 2019-04-14 NOTE — Progress Notes (Signed)
Referring Provider: Nuala Alpha, DO Primary Care Physician:  Nuala Alpha, DO  Reason for Consultation: Nausea and vomiting   IMPRESSION:  Intermittent episodes of nausea and vomiting Marijuana use History of migraines Hyperglycemia noted during recent ER visit History of gastritis and candideal esophagitis 2010 Small liver and kidney cysts on CT scan  0.4 cm low-density area in the right lobe of the liver on CT scan 10/11/2008     - Follow-up ultrasound 10/13/2008 was normal.   Suspected cyclic vomiting syndrome or hyperemesis cannabis. EGD recommended to rule out other gastric and intestinal pathology. Recent CBC and CMP were normal except for glucose of 125.  He is currently managing his symptoms with rare exacerbations requiring medical intervention.  If these episodes become more frequently would consider a of abortive medications including sumatriptan (20mg  intranasal or 6mg  SQ), ondansetron (8mg  sublingual), and diphenhydramine (25-50mg ) to both abort symptoms and hopefully avoid ED visits.  If the frequency of his symptoms increases consider tricyclic antidepressants for prophylaxis.   I have asked the patient to review anxiety, depression, and recent high glycemia with the primary care provider.  Abstaining from heavy cannabis use is important to achieve good outcomes.    PLAN: Continue to maximize supportive care at home for symptom management EGD with biopsies Abstaining from heavy cannabis use is important to achieve good outcomes.   I consented the patient discussing the risks, benefits, and alternatives to endoscopic evaluation. The patient acknowledges these risks and asks that we proceed.  HPI: Samuel Robertson is a 29 y.o. male referred from the ED for further evaluation of nausea and vomiting.  He has anxiety, depression, and hypertension. Migraines in the past, last over 1 year ago. Works at [employer].  The history is obtained through the patient and  review of his electronic health record.  Has not been vaccinated for Covid.   He reports a diagnosis of cyclic vomiting syndrome. Symptoms started at age 82. Episodes would happen weekly to monthly. Over the last few years, it has been about twice a year.  Acute nausea and vomiting that occurs every 20-30 minutes and lasts for 2-3 days. Asymptomatic between episodes.  He manages his symptoms with bowel rest, electrolyte solutions like Powerade, and hot showers. Avoids the ER because he's learned how to manage his symptoms.  Worsened by stress. May be triggered by dairy products and alcohol.  Initially associated with migraines, although he has not noticed a concurrent migraine over the last couple of years.   Smokes marijuana 3 times daily. Denies any association with symptoms of nausea/vomiting. Does not think it's related. Abstained for 2-3 months during a period of more frequent nausea and vomiting and didn't find that it helped.   Evaluated by Dr. Maurene Capes in 2010 at symptom onset.  At that time he had a CT of the abdomen pelvis with contrast, abdominal ultrasound, abdominal x-rays, and an upper GI series with small bowel follow-through.  An EGD 10/14/2008 mild gastritis, candidal esophagitis, esophagitis, and Cameron's erosions.  Biopsies were negative for H. pylori and candidal esophagitis.  CT scan 10/11/2008 did show small cysts of the liver and right kidney.  There was also a 1.4 cm low-density area in the right lobe of the liver.  Follow-up ultrasound 10/13/2008 was normal.  Upper GI series 10/16/2008 showed minimal thickening of the gastric folds and a small amount of gastroesophageal reflux but was otherwise unremarkable.  Seen in the ED 04/07/2019. CMP and CBC were normal except for  a glcose of 125. Was told to follow-up with GI.   Mom with migraines. No known family history of colon cancer or polyps. No family history of uterine/endometrial cancer, pancreatic cancer or gastric/stomach  cancer.   Past Medical History:  Diagnosis Date  . Cyclical vomiting   . Depression   . Hypertension   . Substance abuse (HCC)    [facility]    Past Surgical History:  Procedure Laterality Date  . ESOPHAGOGASTRODUODENOSCOPY  06/08/09   Esophagitis. Gastritis. Bx Negative. - [facility]  . EYE SURGERY     Trauma when child.    Current Outpatient Medications  Medication Sig Dispense Refill  . cetirizine (ZYRTEC ALLERGY) 10 MG tablet Take 1 tablet (10 mg total) by mouth daily. 30 tablet 0  . famotidine (PEPCID) 20 MG tablet Take 1 tablet (20 mg total) by mouth 2 (two) times daily. 30 tablet 0  . fluticasone (FLONASE) 50 MCG/ACT nasal spray Place 1 spray into both nostrils daily. 11.1 mL 0  . lisinopril (ZESTRIL) 10 MG tablet Take 1 tablet (10 mg total) by mouth daily. 30 tablet 0  . ondansetron (ZOFRAN-ODT) 4 MG disintegrating tablet Take 1 tablet (4 mg total) by mouth every 8 (eight) hours as needed for nausea or vomiting. 15 tablet 0  . sucralfate (CARAFATE) 1 GM/10ML suspension Take 10 mLs (1 g total) by mouth 4 (four) times daily -  with meals and at bedtime. 420 mL 0   No current facility-administered medications for this visit.    Allergies as of 04/14/2019  . (No Known Allergies)    Family History  Problem Relation Age of Onset  . Hypertension Mother   . Depression Mother   . Sarcoidosis Mother     Social History   Socioeconomic History  . Marital status: Single    Spouse name: Not on file  . Number of children: Not on file  . Years of education: Not on file  . Highest education level: Not on file  Occupational History  . Not on file  Tobacco Use  . Smoking status: Former Smoker    Quit date: 01/22/2010    Years since quitting: 9.2  . Smokeless tobacco: Never Used  Substance and Sexual Activity  . Alcohol use: Yes    Comment: occasionally  . Drug use: Yes    Types: Marijuana  . Sexual activity: Not on file  Other Topics Concern  . Not on file  Social History  Narrative  . Not on file   Social Determinants of Health   Financial Resource Strain:   . Difficulty of Paying Living Expenses:   Food Insecurity:   . Worried About Programme researcher, broadcasting/film/video in the Last Year:   . Barista in the Last Year:   Transportation Needs:   . Freight forwarder (Medical):   Marland Kitchen Lack of Transportation (Non-Medical):   Physical Activity:   . Days of Exercise per Week:   . Minutes of Exercise per Session:   Stress:   . Feeling of Stress :   Social Connections:   . Frequency of Communication with Friends and Family:   . Frequency of Social Gatherings with Friends and Family:   . Attends Religious Services:   . Active Member of Clubs or Organizations:   . Attends Banker Meetings:   Marland Kitchen Marital Status:   Intimate Partner Violence:   . Fear of Current or Ex-Partner:   . Emotionally Abused:   Marland Kitchen Physically Abused:   .  Sexually Abused:     Review of Systems: 12 system ROS is negative except as noted above with the additions of allergies, anxiety, depression, shortness of breath.   Physical Exam: General:   Alert,  well-nourished, pleasant and cooperative in NAD Head:  Normocephalic and atraumatic. Eyes:  Sclera clear, no icterus.   Conjunctiva pink. Ears:  Normal auditory acuity. Nose:  No deformity, discharge,  or lesions. Mouth:  No deformity or lesions.   Neck:  Supple; no masses or thyromegaly. Lungs:  Clear throughout to auscultation.   No wheezes. Heart:  Regular rate and rhythm; no murmurs. Abdomen:  Soft, nontender, nondistended, normal bowel sounds, no rebound or guarding. No hepatosplenomegaly.   Rectal:  Deferred  Msk:  Symmetrical. No boney deformities LAD: No inguinal or umbilical LAD Extremities:  No clubbing or edema. Neurologic:  Alert and  oriented x4;  grossly nonfocal Skin:  Intact without significant lesions or rashes. Psych:  Alert and cooperative. Normal mood and affect.    Seanna Sisler L. Orvan Falconer, MD,  [facility] 04/14/2019, 9:14 AM

## 2019-04-14 NOTE — Patient Instructions (Signed)
You have been scheduled for an endoscopy. Please follow written instructions given to you at your visit today. If you use inhalers (even only as needed), please bring them with you on the day of your procedure.  I value your feedback and thank you for entrusting us with your care. If you get a [facility] patient survey, I would appreciate you taking the time to let us know about your experience today. Thank you!   Due to recent changes in healthcare laws, you may see the results of your imaging and laboratory studies on MyChart before your provider has had a chance to review them.  We understand that in some cases there may be results that are confusing or concerning to you. Not all laboratory results come back in the same time frame and the provider may be waiting for multiple results in order to interpret others.  Please give us 48 hours in order for your provider to thoroughly review all the results before contacting the office for clarification of your results.     

## 2019-04-26 ENCOUNTER — Other Ambulatory Visit: Payer: Self-pay

## 2019-04-26 ENCOUNTER — Encounter: Payer: Self-pay | Admitting: Family Medicine

## 2019-04-26 ENCOUNTER — Ambulatory Visit (INDEPENDENT_AMBULATORY_CARE_PROVIDER_SITE_OTHER): Payer: BC Managed Care – PPO | Admitting: Family Medicine

## 2019-04-26 VITALS — BP 110/70 | HR 73 | Ht 70.0 in | Wt 151.6 lb

## 2019-04-26 DIAGNOSIS — I10 Essential (primary) hypertension: Secondary | ICD-10-CM

## 2019-04-26 NOTE — Progress Notes (Signed)
    SUBJECTIVE:   CHIEF COMPLAINT / HPI:   HTN Follow Up Samuel Robertson is a 29 y/o male with essential HTN well controlled on Lisinopril 10mg  daily. He states he feels well. He was having some "migraines" a few weeks ago but they have improved. No headaches today, no blurry vision or vision changes, no chest pain or shortness of breath. He is compliant with Lisinopril. He did endorse forgetting to get his refill for a few days but subsequently picked up the medication and has been taking it. Patient states he forgot to take it this morning but that is unusual for him. His BP in office is well controlled.  He does report using a blood pressure cuff at home and says sometimes his systolic pressure is in the 140-180s but he "only check it when I'm sick". He doesn't not check it on a regular basis.  PERTINENT  PMH / PSH: HTN  OBJECTIVE:   BP 110/70   Pulse 73   Ht 5\' 10"  (1.778 m)   Wt 151 lb 9.6 oz (68.8 kg)   SpO2 100%   BMI 21.75 kg/m   Gen: Alert and Oriented x 3, NAD CV: RRR, no murmurs, normal S1, S2 split Resp: CTAB, no wheezing, rales, or rhonchi, comfortable work of breathing Ext: no clubbing, cyanosis, or edema Skin: warm, dry, intact, no rashes  ASSESSMENT/PLAN:   Essential hypertension, benign Well controlled in clinic today but his periodic home checks indicate it may not be well controlled at home. I suspect he is either checking it when he is sick or not feeling well and thus this could have an impact on the readings. - Cont Lisinopril 10mg  daily, recent CMP on 04/07/19 was normal - For two weeks patient will check BP at rest while feeling well, record it, and send it to me via MyChart. Will change therapy if his BP is not well controlled at home      , DO [facility]

## 2019-04-26 NOTE — Patient Instructions (Signed)
It was great to see you today! Thank you for letting me participate in your care!  Today, we discussed your blood pressure and it is well controlled today's visit on your current medication. Please keep taking Lisinopril 10mg  daily. Please check your blood pressure in the mornings daily for two weeks and then send me the log on Mychart.   Be well, , DO PGY-3, [facility]

## 2019-04-27 NOTE — Assessment & Plan Note (Signed)
Well controlled in clinic today but his periodic home checks indicate it may not be well controlled at home. I suspect he is either checking it when he is sick or not feeling well and thus this could have an impact on the readings. - Cont Lisinopril 10mg  daily, recent CMP on 04/07/19 was normal - For two weeks patient will check BP at rest while feeling well, record it, and send it to me via MyChart. Will change therapy if his BP is not well controlled at home

## 2019-04-28 ENCOUNTER — Ambulatory Visit (INDEPENDENT_AMBULATORY_CARE_PROVIDER_SITE_OTHER): Payer: BC Managed Care – PPO

## 2019-04-28 DIAGNOSIS — Z1159 Encounter for screening for other viral diseases: Secondary | ICD-10-CM

## 2019-04-29 ENCOUNTER — Ambulatory Visit (INDEPENDENT_AMBULATORY_CARE_PROVIDER_SITE_OTHER): Payer: BC Managed Care – PPO

## 2019-04-29 ENCOUNTER — Other Ambulatory Visit: Payer: Self-pay | Admitting: Gastroenterology

## 2019-04-29 DIAGNOSIS — Z1159 Encounter for screening for other viral diseases: Secondary | ICD-10-CM

## 2019-04-30 LAB — SARS CORONAVIRUS 2 (TAT 6-24 HRS): SARS Coronavirus 2: NEGATIVE

## 2019-05-02 ENCOUNTER — Ambulatory Visit (AMBULATORY_SURGERY_CENTER): Payer: BC Managed Care – PPO | Admitting: Gastroenterology

## 2019-05-02 ENCOUNTER — Encounter: Payer: Self-pay | Admitting: Gastroenterology

## 2019-05-02 ENCOUNTER — Other Ambulatory Visit: Payer: Self-pay

## 2019-05-02 VITALS — BP 122/85 | HR 54 | Temp 95.5°F | Resp 20

## 2019-05-02 DIAGNOSIS — G43909 Migraine, unspecified, not intractable, without status migrainosus: Secondary | ICD-10-CM

## 2019-05-02 DIAGNOSIS — K2091 Esophagitis, unspecified with bleeding: Secondary | ICD-10-CM | POA: Diagnosis not present

## 2019-05-02 DIAGNOSIS — K2970 Gastritis, unspecified, without bleeding: Secondary | ICD-10-CM | POA: Diagnosis not present

## 2019-05-02 DIAGNOSIS — R112 Nausea with vomiting, unspecified: Secondary | ICD-10-CM | POA: Diagnosis not present

## 2019-05-02 DIAGNOSIS — K2951 Unspecified chronic gastritis with bleeding: Secondary | ICD-10-CM | POA: Diagnosis not present

## 2019-05-02 DIAGNOSIS — R1115 Cyclical vomiting syndrome unrelated to migraine: Secondary | ICD-10-CM

## 2019-05-02 HISTORY — DX: Migraine, unspecified, not intractable, without status migrainosus: G43.909

## 2019-05-02 HISTORY — PX: UPPER GASTROINTESTINAL ENDOSCOPY: SHX188

## 2019-05-02 MED ORDER — SODIUM CHLORIDE 0.9 % IV SOLN: 500.0000 mL | Freq: Once | INTRAVENOUS | Status: DC

## 2019-05-02 MED ORDER — FAMOTIDINE 20 MG PO TABS: 20.0000 mg | ORAL_TABLET | Freq: Two times a day (BID) | ORAL | 1 refills | Status: DC

## 2019-05-02 MED ORDER — PANTOPRAZOLE SODIUM 40 MG PO TBEC: 40.0000 mg | DELAYED_RELEASE_TABLET | Freq: Two times a day (BID) | ORAL | 1 refills | Status: AC

## 2019-05-02 NOTE — Progress Notes (Signed)
Called to room to assist during endoscopic procedure.  Patient ID and intended procedure confirmed with present staff. Received instructions for my participation in the procedure from the performing physician.  

## 2019-05-02 NOTE — Progress Notes (Addendum)
Pt's states no medical or surgical changes since previsit or office visit.  Temp LC Vitals DT  Pt states used marijuana around 9-10am this AM, denies any other drug use, Dr Orvan Falconer and Charlsie Merles, CRNA informed.

## 2019-05-02 NOTE — Patient Instructions (Signed)
Impression/Recommendations:  Gastritis handout given to patient.  Resume previous diet. Continue present medications including Famotidine 20 mg 2 times daily. Begin pantoprazole 40 mg 2 times daily for 8 weeks.  Avoid all NSAIDs.  Tylenol only.  Await pathology results.  Return to GI office in 6-8 weeks.  Earlier if needed.  YOU HAD AN ENDOSCOPIC PROCEDURE TODAY AT THE [facility]:   Refer to the procedure report that was given to you for any specific questions about what was found during the examination.  If the procedure report does not answer your questions, please call your gastroenterologist to clarify.  If you requested that your care partner not be given the details of your procedure findings, then the procedure report has been included in a sealed envelope for you to review at your convenience later.  YOU SHOULD EXPECT: Some feelings of bloating in the abdomen. Passage of more gas than usual.  Walking can help get rid of the air that was put into your GI tract during the procedure and reduce the bloating. If you had a lower endoscopy (such as a colonoscopy or flexible sigmoidoscopy) you may notice spotting of blood in your stool or on the toilet paper. If you underwent a bowel prep for your procedure, you may not have a normal bowel movement for a few days.  Please Note:  You might notice some irritation and congestion in your nose or some drainage.  This is from the oxygen used during your procedure.  There is no need for concern and it should clear up in a day or so.  SYMPTOMS TO REPORT IMMEDIATELY:   Following upper endoscopy (EGD)  Vomiting of blood or coffee ground material  New chest pain or pain under the shoulder blades  Painful or persistently difficult swallowing  New shortness of breath  Fever of 100F or higher  Black, tarry-looking stools  For urgent or emergent issues, a gastroenterologist can be reached at any hour by calling [phone]. Do not  use MyChart messaging for urgent concerns.    DIET:  We do recommend a small meal at first, but then you may proceed to your regular diet.  Drink plenty of fluids but you should avoid alcoholic beverages for 24 hours.  ACTIVITY:  You should plan to take it easy for the rest of today and you should NOT DRIVE or use heavy machinery until tomorrow (because of the sedation medicines used during the test).    FOLLOW UP: Our staff will call the number listed on your records 48-72 hours following your procedure to check on you and address any questions or concerns that you may have regarding the information given to you following your procedure. If we do not reach you, we will leave a message.  We will attempt to reach you two times.  During this call, we will ask if you have developed any symptoms of COVID 19. If you develop any symptoms (ie: fever, flu-like symptoms, shortness of breath, cough etc.) before then, please call [phone].  If you test positive for Covid 19 in the 2 weeks post procedure, please call and report this information to [place].    If any biopsies were taken you will be contacted by phone or by letter within the next 1-3 weeks.  Please call us at [phone] if you have not heard about the biopsies in 3 weeks.    SIGNATURES/CONFIDENTIALITY: You and/or your care partner have signed paperwork which will be entered into your electronic  medical record.  These signatures attest to the fact that that the information above on your After Visit Summary has been reviewed and is understood.  Full responsibility of the confidentiality of this discharge information lies with you and/or your care-partner.

## 2019-05-02 NOTE — Progress Notes (Signed)
Report given to PACU, vss 

## 2019-05-02 NOTE — Op Note (Signed)
[facility] Patient Name: Samuel Robertson Procedure Date: 05/02/2019 3:16 PM MRN: [id number] Endoscopist: Tressia Danas MD, MD Age: 29 Referring MD:  Date of Birth: 11/30/1990 Gender: Male Account #: [id number] Procedure:                Upper GI endoscopy Indications:              Intermittent episodes of nausea and vomiting                           Marijuana use                           History of migraines                           Hyperglycemia noted during recent ER visit                           History of gastritis and candideal esophagitis 2010 Medicines:                Monitored Anesthesia Care Procedure:                Pre-Anesthesia Assessment:                           - Prior to the procedure, a History and Physical                            was performed, and patient medications and                            allergies were reviewed. The patient's tolerance of                            previous anesthesia was also reviewed. The risks                            and benefits of the procedure and the sedation                            options and risks were discussed with the patient.                            All questions were answered, and informed consent                            was obtained. Prior Anticoagulants: The patient has                            taken no previous anticoagulant or antiplatelet                            agents. ASA Grade Assessment: II - A patient with  mild systemic disease. After reviewing the risks                            and benefits, the patient was deemed in                            satisfactory condition to undergo the procedure.                           After obtaining informed consent, the endoscope was                            passed under direct vision. Throughout the                            procedure, the patient's blood pressure, pulse, and   oxygen saturations were monitored continuously. The                            Endoscope was introduced through the mouth, and                            advanced to the third part of duodenum. The upper                            GI endoscopy was accomplished without difficulty.                            The patient tolerated the procedure well. Scope In: Scope Out: Findings:                 The examined esophagus was normal. Biopsies were                            obtained from the proximal and distal esophagus                            with cold forceps for histology of suspected                            eosinophilic esophagitis and reflux.                           Diffuse minimal inflammation characterized by                            erythema, friability and granularity was found in                            the gastric body. Biopsies were taken from the                            antrum, body, and fundus with a cold forceps for  histology. Estimated blood loss was minimal.                           Diffuse moderately erythematous mucosa without                            active bleeding and with no stigmata of bleeding                            was found in the duodenal bulb. Biopsies were taken                            with a cold forceps for histology. Estimated blood                            loss was minimal.                           The cardia and gastric fundus were normal on                            retroflexion.                           The exam was otherwise without abnormality. Complications:            No immediate complications. Estimated blood loss:                            Minimal. Estimated Blood Loss:     Estimated blood loss was minimal. Impression:               - Normal esophagus. Biopsied.                           - Gastritis. Biopsied.                           - Erythematous duodenopathy. Biopsied.                            - The examination was otherwise normal. Recommendation:           - Patient has a contact number available for                            emergencies. The signs and symptoms of potential                            delayed complications were discussed with the                            patient. Return to normal activities tomorrow.                            Written discharge instructions were provided to the  patient.                           - Resume previous diet.                           - Continue present medications including famotidine                            20 mg twice daily. Start pantoprazole 40 mg BID x 8                            weeks.                           - Avoid all NSAIDs.                           - Await pathology results.                           - Return to GI office in 6-8 weeks, earlier if                            needed. Tressia Danas MD, MD 05/02/2019 3:46:19 PM This report has been signed electronically.

## 2019-05-04 ENCOUNTER — Telehealth: Payer: Self-pay | Admitting: *Deleted

## 2019-05-04 NOTE — Telephone Encounter (Signed)
  Follow up Call-  Call back number 05/02/2019  Post procedure Call Back phone  # [phone]  Permission to leave phone message Yes  Some recent data might be hidden     Patient questions:  Do you have a fever, pain , or abdominal swelling? No. Pain Score  0 *  Have you tolerated food without any problems? Yes.    Have you been able to return to your normal activities? Yes.    Do you have any questions about your discharge instructions: Diet   No. Medications  No. Follow up visit  No.  Do you have questions or concerns about your Care? No.  Actions: * If pain score is 4 or above: No action needed, pain <4.  1. Have you developed a fever since your procedure? no  2.   Have you had an respiratory symptoms (SOB or cough) since your procedure? no  3.   Have you tested positive for COVID 19 since your procedure no  4.   Have you had any family members/close contacts diagnosed with the COVID 19 since your procedure?  no   If yes to any of these questions please route to Laverna Peace, RN and Charlett Lango, RN

## 2019-05-04 NOTE — Telephone Encounter (Signed)
Message left

## 2019-05-09 ENCOUNTER — Encounter: Payer: Self-pay | Admitting: Gastroenterology

## 2019-05-23 ENCOUNTER — Telehealth: Payer: Self-pay

## 2019-05-23 NOTE — Telephone Encounter (Signed)
I received a fax note from [facility] stating that insurance will only cover  MAX 1 per day.  I faxed back to pharmacy and asked the to advise what insurance would cover bid.  I never received a response.  I contacted patient this morning to see if he had heard from his pharmacy and he stated no.  I asked patient to contact his insurance company to give him an alternative drug that he may take bid.  I told patient to call me back so that we may submit a prescription to his pharmacy. Patient verbalized understanding.

## 2019-05-25 ENCOUNTER — Other Ambulatory Visit: Payer: Self-pay | Admitting: Gastroenterology

## 2019-06-20 ENCOUNTER — Ambulatory Visit: Payer: BC Managed Care – PPO | Admitting: Gastroenterology

## 2019-07-26 ENCOUNTER — Encounter: Payer: Self-pay | Admitting: Family Medicine

## 2019-08-30 ENCOUNTER — Other Ambulatory Visit: Payer: Self-pay

## 2019-08-30 MED ORDER — LISINOPRIL 10 MG PO TABS: 10.0000 mg | ORAL_TABLET | Freq: Every day | ORAL | 0 refills | Status: DC

## 2019-08-30 NOTE — Telephone Encounter (Signed)
Patient calls nurse line regarding rx refill for lisinopril. Patient has scheduled follow up appointment with new PCP for 09/13/19.   Patient requests refill to be sent to [facility] on [address]eth, RN

## 2019-09-13 ENCOUNTER — Ambulatory Visit: Payer: BC Managed Care – PPO | Admitting: Family Medicine

## 2019-09-13 NOTE — Progress Notes (Deleted)
    SUBJECTIVE:   CHIEF COMPLAINT / HPI:   ***  PERTINENT  PMH / PSH: ***  OBJECTIVE:   There were no vitals taken for this visit.  ***  ASSESSMENT/PLAN:   No problem-specific Assessment & Plan notes found for this encounter.     Saahir Prude, MD [facility]  

## 2019-09-24 ENCOUNTER — Other Ambulatory Visit: Payer: Self-pay | Admitting: Family Medicine

## 2019-10-24 ENCOUNTER — Other Ambulatory Visit: Payer: Self-pay | Admitting: Family Medicine

## 2019-11-06 ENCOUNTER — Other Ambulatory Visit: Payer: Self-pay | Admitting: Family Medicine

## 2020-06-16 ENCOUNTER — Other Ambulatory Visit: Payer: Self-pay | Admitting: Family Medicine

## 2021-06-11 ENCOUNTER — Encounter: Payer: Self-pay | Admitting: *Deleted
